# Patient Record
Sex: Female | Born: 1980 | Race: Black or African American | Hispanic: No | Marital: Single | Smoking: Former smoker
Health system: Southern US, Community
[De-identification: ages and names within clinical notes are randomized; demographics above are authoritative.]

## PROBLEM LIST (undated history)

## (undated) DIAGNOSIS — F32A Depression, unspecified: Secondary | ICD-10-CM

## (undated) DIAGNOSIS — N760 Acute vaginitis: Principal | ICD-10-CM

## (undated) DIAGNOSIS — A549 Gonococcal infection, unspecified: Secondary | ICD-10-CM

## (undated) DIAGNOSIS — F329 Major depressive disorder, single episode, unspecified: Secondary | ICD-10-CM

## (undated) DIAGNOSIS — R87629 Unspecified abnormal cytological findings in specimens from vagina: Secondary | ICD-10-CM

## (undated) DIAGNOSIS — R87619 Unspecified abnormal cytological findings in specimens from cervix uteri: Secondary | ICD-10-CM

## (undated) DIAGNOSIS — IMO0002 Reserved for concepts with insufficient information to code with codable children: Secondary | ICD-10-CM

## (undated) DIAGNOSIS — A749 Chlamydial infection, unspecified: Secondary | ICD-10-CM

## (undated) DIAGNOSIS — B9689 Other specified bacterial agents as the cause of diseases classified elsewhere: Secondary | ICD-10-CM

## (undated) DIAGNOSIS — N926 Irregular menstruation, unspecified: Secondary | ICD-10-CM

## (undated) DIAGNOSIS — B977 Papillomavirus as the cause of diseases classified elsewhere: Secondary | ICD-10-CM

## (undated) HISTORY — DX: Chlamydial infection, unspecified: A74.9

## (undated) HISTORY — DX: Acute vaginitis: N76.0

## (undated) HISTORY — DX: Unspecified abnormal cytological findings in specimens from vagina: R87.629

## (undated) HISTORY — DX: Reserved for concepts with insufficient information to code with codable children: IMO0002

## (undated) HISTORY — PX: LEEP: SHX91

## (undated) HISTORY — DX: Other specified bacterial agents as the cause of diseases classified elsewhere: B96.89

## (undated) HISTORY — PX: OTHER SURGICAL HISTORY: SHX169

## (undated) HISTORY — DX: Irregular menstruation, unspecified: N92.6

## (undated) HISTORY — DX: Gonococcal infection, unspecified: A54.9

## (undated) HISTORY — DX: Papillomavirus as the cause of diseases classified elsewhere: B97.7

## (undated) HISTORY — DX: Unspecified abnormal cytological findings in specimens from cervix uteri: R87.619

---

## 2001-02-24 ENCOUNTER — Encounter: Payer: Self-pay | Admitting: *Deleted

## 2001-02-24 ENCOUNTER — Emergency Department (HOSPITAL_COMMUNITY): Admission: EM | Admit: 2001-02-24 | Discharge: 2001-02-24 | Payer: Self-pay | Admitting: *Deleted

## 2001-09-04 ENCOUNTER — Other Ambulatory Visit: Admission: RE | Admit: 2001-09-04 | Discharge: 2001-09-04 | Payer: Self-pay

## 2002-02-13 ENCOUNTER — Other Ambulatory Visit: Admission: RE | Admit: 2002-02-13 | Discharge: 2002-02-13 | Payer: Self-pay | Admitting: Obstetrics and Gynecology

## 2002-10-29 ENCOUNTER — Emergency Department (HOSPITAL_COMMUNITY): Admission: EM | Admit: 2002-10-29 | Discharge: 2002-10-30 | Payer: Self-pay

## 2003-04-19 ENCOUNTER — Emergency Department (HOSPITAL_COMMUNITY): Admission: EM | Admit: 2003-04-19 | Discharge: 2003-04-19 | Payer: Self-pay | Admitting: Emergency Medicine

## 2003-06-11 ENCOUNTER — Emergency Department (HOSPITAL_COMMUNITY): Admission: EM | Admit: 2003-06-11 | Discharge: 2003-06-12 | Payer: Self-pay | Admitting: Emergency Medicine

## 2003-07-06 ENCOUNTER — Inpatient Hospital Stay (HOSPITAL_COMMUNITY): Admission: AD | Admit: 2003-07-06 | Discharge: 2003-07-06 | Payer: Self-pay | Admitting: Family Medicine

## 2003-07-06 ENCOUNTER — Emergency Department (HOSPITAL_COMMUNITY): Admission: EM | Admit: 2003-07-06 | Discharge: 2003-07-06 | Payer: Self-pay | Admitting: Emergency Medicine

## 2003-09-25 ENCOUNTER — Ambulatory Visit (HOSPITAL_COMMUNITY): Admission: AD | Admit: 2003-09-25 | Discharge: 2003-09-25 | Payer: Self-pay | Admitting: Obstetrics and Gynecology

## 2003-11-09 ENCOUNTER — Inpatient Hospital Stay (HOSPITAL_COMMUNITY): Admission: AD | Admit: 2003-11-09 | Discharge: 2003-11-09 | Payer: Self-pay | Admitting: Obstetrics

## 2003-12-14 ENCOUNTER — Inpatient Hospital Stay (HOSPITAL_COMMUNITY): Admission: AD | Admit: 2003-12-14 | Discharge: 2003-12-15 | Payer: Self-pay | Admitting: Obstetrics and Gynecology

## 2003-12-23 ENCOUNTER — Inpatient Hospital Stay (HOSPITAL_COMMUNITY): Admission: AD | Admit: 2003-12-23 | Discharge: 2003-12-26 | Payer: Self-pay | Admitting: Obstetrics & Gynecology

## 2005-03-10 ENCOUNTER — Inpatient Hospital Stay (HOSPITAL_COMMUNITY): Admission: AD | Admit: 2005-03-10 | Discharge: 2005-03-11 | Payer: Self-pay | Admitting: Obstetrics & Gynecology

## 2006-04-06 ENCOUNTER — Emergency Department (HOSPITAL_COMMUNITY): Admission: EM | Admit: 2006-04-06 | Discharge: 2006-04-07 | Payer: Self-pay | Admitting: Emergency Medicine

## 2006-10-11 ENCOUNTER — Emergency Department (HOSPITAL_COMMUNITY): Admission: EM | Admit: 2006-10-11 | Discharge: 2006-10-11 | Payer: Self-pay | Admitting: Family Medicine

## 2007-10-04 ENCOUNTER — Ambulatory Visit: Payer: Self-pay | Admitting: Gastroenterology

## 2008-12-01 ENCOUNTER — Emergency Department (HOSPITAL_COMMUNITY): Admission: EM | Admit: 2008-12-01 | Discharge: 2008-12-01 | Payer: Self-pay | Admitting: Emergency Medicine

## 2008-12-14 ENCOUNTER — Emergency Department (HOSPITAL_COMMUNITY): Admission: EM | Admit: 2008-12-14 | Discharge: 2008-12-14 | Payer: Self-pay | Admitting: Ophthalmology

## 2009-02-03 ENCOUNTER — Other Ambulatory Visit: Admission: RE | Admit: 2009-02-03 | Discharge: 2009-02-03 | Payer: Self-pay | Admitting: Obstetrics and Gynecology

## 2010-04-13 LAB — WET PREP, GENITAL
Clue Cells Wet Prep HPF POC: NONE SEEN
Trich, Wet Prep: NONE SEEN
Yeast Wet Prep HPF POC: NONE SEEN

## 2010-04-13 LAB — URINALYSIS, ROUTINE W REFLEX MICROSCOPIC
Bilirubin Urine: NEGATIVE
Bilirubin Urine: NEGATIVE
Glucose, UA: 250 mg/dL — AB
Glucose, UA: NEGATIVE mg/dL
Hgb urine dipstick: NEGATIVE
Hgb urine dipstick: NEGATIVE
Ketones, ur: NEGATIVE mg/dL
Ketones, ur: NEGATIVE mg/dL
Nitrite: NEGATIVE
Nitrite: NEGATIVE
Protein, ur: NEGATIVE mg/dL
Protein, ur: NEGATIVE mg/dL
Specific Gravity, Urine: 1.015 (ref 1.005–1.030)
Specific Gravity, Urine: 1.025 (ref 1.005–1.030)
Urobilinogen, UA: 0.2 mg/dL (ref 0.0–1.0)
Urobilinogen, UA: 1 mg/dL (ref 0.0–1.0)
pH: 6 (ref 5.0–8.0)
pH: 7.5 (ref 5.0–8.0)

## 2010-04-13 LAB — URINE MICROSCOPIC-ADD ON

## 2010-04-13 LAB — PREGNANCY, URINE: Preg Test, Ur: NEGATIVE

## 2010-04-13 LAB — GC/CHLAMYDIA PROBE AMP, GENITAL: Chlamydia, DNA Probe: NEGATIVE

## 2010-05-25 NOTE — Consult Note (Signed)
NAME:  Graziani, Shauntea C.           ACCOUNT NO.:  [id number]   MEDICAL RECORD NO.:  [id number]         PATIENT TYPE:  PAMB   LOCATION:  DAY                           FACILITY:  [facility]   PHYSICIAN:  Kassie Mends, M.D.      DATE OF BIRTH:  May 24, 1980   DATE OF CONSULTATION:  DATE OF DISCHARGE:                                 CONSULTATION   REFERRING PHYSICIAN:  Tilda Burrow, MD   REASON FOR CONSULTATION:  Heme-positive stool.   HISTORY OF PRESENT ILLNESS:  Ms. Hubers is a 30 year old female who had  Hemoccult obtained in the office and they were positive.  She complains  of abdominal pain after eating.  She has bad cramps and that has little  balls of stool that passes.  She does not strain to have a bowel  movement.  Having a bowel movement sometimes makes her stomach feel  better.  She is also having random rectal pain.  She denies any rectal  itching.  No weight loss.  She denies any problem swallowing, nausea,  vomiting, fever, or chills.  She denies any diarrhea or black tarry  stools.  She has been having hard stools for the last 1-1/2 years.  She  has not had a menstrual period for approximately 2 years.  Sometimes,  she does spot because she has an IUD.  She has had 2 vaginal births,  with the first child she had a tear, which required stitches.   PAST MEDICAL HISTORY:  Cervix prolapse.   PAST SURGICAL HISTORY:  Dental surgery with extraction of 12 teeth.   ALLERGIES:  No known drug allergies.   MEDICATIONS:  1. Ibuprofen as needed.  2. Hydrocodone as needed.   FAMILY HISTORY:  She has a family history colon cancer and colon polyps.   SOCIAL HISTORY:  She is single and has 2 children age 33 and 65.  She is  currently a Physicist, medical, working on Copy.  She was laid off from [employer].  She quit smoking approximately  6 months ago.  She denies any alcohol use.   PHYSICAL EXAMINATION:  VITAL SIGNS:  Weight 134 pounds, height 64  inches,  temperature 98.2, blood pressure 120/74, and pulse 80.  GENERAL:  She is in no apparent distress.  Alert and oriented x4.HEENT:  Atraumatic, normocephalic with edema bilaterally in her face, posterior  oropharynx without erythema or exudate.  No oral lesions  appreciated.NECK:  Full range of motion.  No lymphadenopathy.LUNGS:  Clear to auscultation bilaterally.CARDIOVASCULAR:  Regular rhythm.  Normal S1 and S2.  ABDOMEN:  Bowel sounds are present, soft, nondistended with mild  tenderness to palpation in the bilateral lower quadrants without rebound  or guarding.EXTREMITIES:  No cyanosis or edema.NEURO:  She has no focal  neurologic deficits.   ASSESSMENT:  Ms. Sipe is a 30 year old female with constipation and  rectal bleeding.  The differential diagnoses includes hemorrhoids, anal  fissure, and likely colorectal cancer or polyp. Thank you for allowing  me to see Ms. Mcmonigle in consultation.  My recommendations follow.   RECOMMENDATIONS:  1. Ms. Arambula will  be scheduled for a colonoscopy in the second week      of October to allow her to recover from her oral surgery.  She will      be given a Trilyte bowel prep.  2. For her constipation, she needs to drink 6-8 cups of water a day,      high-fiber diet, and if that is not sufficient then we will add a      fiber supplements and/or Amitiza.  3. She has a follow up appointment to see me in 2 months.      Kassie Mends, M.D.  Electronically Signed     SM/MEDQ  D:  10/04/2007  T:  10/05/2007  Job:  [id number]   cc:   Tilda Burrow, M.D.  Fax: [phone]

## 2010-08-16 ENCOUNTER — Encounter: Payer: Self-pay | Admitting: *Deleted

## 2010-08-16 DIAGNOSIS — S71009A Unspecified open wound, unspecified hip, initial encounter: Secondary | ICD-10-CM | POA: Insufficient documentation

## 2010-08-16 DIAGNOSIS — Y92009 Unspecified place in unspecified non-institutional (private) residence as the place of occurrence of the external cause: Secondary | ICD-10-CM | POA: Insufficient documentation

## 2010-08-16 DIAGNOSIS — S71109A Unspecified open wound, unspecified thigh, initial encounter: Secondary | ICD-10-CM | POA: Insufficient documentation

## 2010-08-16 DIAGNOSIS — W261XXA Contact with sword or dagger, initial encounter: Secondary | ICD-10-CM | POA: Insufficient documentation

## 2010-08-16 DIAGNOSIS — J45909 Unspecified asthma, uncomplicated: Secondary | ICD-10-CM | POA: Insufficient documentation

## 2010-08-16 DIAGNOSIS — W260XXA Contact with knife, initial encounter: Secondary | ICD-10-CM | POA: Insufficient documentation

## 2010-08-16 NOTE — ED Notes (Signed)
Pt has small laceration to right inner thigh; pt states she was opening her oven and had knife in her hand when she accidentally cut leg

## 2010-08-17 ENCOUNTER — Emergency Department (HOSPITAL_COMMUNITY)
Admission: EM | Admit: 2010-08-17 | Discharge: 2010-08-17 | Disposition: A | Discharge status: Home / Self Care | Payer: Medicaid Other | Attending: Emergency Medicine | Admitting: Emergency Medicine

## 2010-08-17 DIAGNOSIS — S71111A Laceration without foreign body, right thigh, initial encounter: Secondary | ICD-10-CM

## 2010-08-17 HISTORY — DX: Major depressive disorder, single episode, unspecified: F32.9

## 2010-08-17 HISTORY — DX: Depression, unspecified: F32.A

## 2010-08-17 MED ORDER — IBUPROFEN 800 MG PO TABS
800.0000 mg | ORAL_TABLET | Freq: Once | ORAL | Status: AC
  Administered 2010-08-17: 800 mg via ORAL
  Filled 2010-08-17: qty 1

## 2010-08-17 MED ORDER — IBUPROFEN 800 MG PO TABS: 800.0000 mg | ORAL_TABLET | Freq: Three times a day (TID) | ORAL | Status: DC

## 2010-08-17 MED ORDER — TETANUS-DIPHTH-ACELL PERTUSSIS 5-2.5-18.5 LF-MCG/0.5 IM SUSP
0.5000 mL | Freq: Once | INTRAMUSCULAR | Status: AC
  Administered 2010-08-17: 0.5 mL via INTRAMUSCULAR
  Filled 2010-08-17: qty 0.5

## 2010-08-17 NOTE — ED Provider Notes (Signed)
History     CSN: [id number] Arrival date & time: 08/17/2010 12:00 AM  Chief Complaint  Patient presents with  . Laceration    to right inner thigh   HPI Comments: Patient suffered a small stab incision to her right proximal medial thigh approximately 2 hours prior to arrival, onset was acute, pain is persistent, mild, worse with palpation. There was some associated bleeding which has since resolved. She did poor hydrogen peroxide into the wound prior to arrival. In eyes any other injuries. States this was an accidental stab that she had a knife in her hand while trying to open the oven door.  Patient is a 30 y.o. female presenting with skin laceration. The history is provided by the patient.  Laceration     Past Medical History  Diagnosis Date  . Asthma   . Depression     History reviewed. No pertinent past surgical history.  History reviewed. No pertinent family history.  History  Substance Use Topics  . Smoking status: Current Some Day Smoker  . Smokeless tobacco: Not on file  . Alcohol Use: No    OB History    Grav Para Term Preterm Abortions TAB SAB Ect Mult Living                  Review of Systems  Constitutional: Negative for fever.  Gastrointestinal: Negative for vomiting.  Skin: Positive for wound.       Laceration  Neurological: Negative for weakness and numbness.    Physical Exam  BP 119/74  Pulse 69  Temp(Src) 98.5 F (36.9 C) (Oral)  Resp 20  Ht 5\' 5"  (1.651 m)  Wt 139 lb (63.05 kg)  BMI 23.13 kg/m2  SpO2 100%  Physical Exam  Constitutional: She appears well-developed and well-nourished. No distress.  Eyes: Conjunctivae are normal. No scleral icterus.  Pulmonary/Chest: Effort normal. No respiratory distress.  Musculoskeletal: Normal range of motion. She exhibits tenderness. She exhibits no edema.       Tender to palpation over the laceration site.  Neurological: She is alert.       Odor and sensory intact to the right lower extremity. Gait  is normal.  Skin: She is not diaphoretic.       1 cm linear incision to the right medial proximal thigh. Bleeding controlled, subcutaneous fat exposed.    ED Course  LACERATION REPAIR Date/Time: 08/17/2010 12:43 AM Performed by: Eber Hong D Authorized by: Eber Hong D Consent: Verbal consent obtained. Written consent not obtained. Risks and benefits: risks, benefits and alternatives were discussed Consent given by: patient Patient understanding: patient states understanding of the procedure being performed Patient identity confirmed: verbally with patient Location: Right thigh. Laceration length: 1 cm Foreign bodies: no foreign bodies Tendon involvement: none Nerve involvement: none Vascular damage: no Anesthesia method: None. Patient sedated: no Preparation: Patient was prepped and draped in the usual sterile fashion. Irrigation solution: saline Amount of cleaning: standard Debridement: none Degree of undermining: none Skin closure: staples Number of sutures: 2 Technique: simple Approximation: close Approximation difficulty: simple Dressing: 4x4 sterile gauze Patient tolerance: Patient tolerated the procedure well with no immediate complications.    MDM Overall patient appears well without signs of distress, bleeding, but vascular compromise. Please see repair note.      Vida Roller, MD 08/17/10 [phone]

## 2010-08-27 ENCOUNTER — Encounter (HOSPITAL_COMMUNITY): Payer: Self-pay | Admitting: Emergency Medicine

## 2010-08-27 ENCOUNTER — Emergency Department (HOSPITAL_COMMUNITY)
Admission: EM | Admit: 2010-08-27 | Discharge: 2010-08-27 | Disposition: A | Discharge status: Home / Self Care | Payer: Medicaid Other | Attending: Emergency Medicine | Admitting: Emergency Medicine

## 2010-08-27 DIAGNOSIS — L089 Local infection of the skin and subcutaneous tissue, unspecified: Secondary | ICD-10-CM | POA: Insufficient documentation

## 2010-08-27 DIAGNOSIS — Z4802 Encounter for removal of sutures: Secondary | ICD-10-CM | POA: Insufficient documentation

## 2010-08-27 MED ORDER — SULFAMETHOXAZOLE-TRIMETHOPRIM 800-160 MG PO TABS: 1.0000 | ORAL_TABLET | Freq: Two times a day (BID) | ORAL | Status: AC

## 2010-08-27 MED ORDER — CEPHALEXIN 500 MG PO CAPS: 1000.0000 mg | ORAL_CAPSULE | Freq: Two times a day (BID) | ORAL | Status: AC

## 2010-08-27 NOTE — ED Notes (Signed)
Staple removal kit at bedside  

## 2010-08-27 NOTE — ED Notes (Signed)
Pt here to have staples removed from her right inner thigh. Pt states the is drainage from the incision.

## 2010-08-27 NOTE — ED Provider Notes (Signed)
Scribed for Dr. Fonnie Jarvis, the patient was seen in room 07. This chart was scribed by Hillery Hunter. This patient's care was started at 09:59.  History     CSN: [id number] Arrival date & time: 08/27/2010  9:19 AM  Chief Complaint  Patient presents with  . Suture / Staple Removal   HPI  Kimberly [name] is a 30 y.o. female who presents to the Emergency Department as per scheduled staples removal from right inner thigh. She has had two days of gradually increasing pain and tenderness to the area with a small amount of drainage and redness. She denies fever, cough, shortness of breath and other systemic complaints.   Past Medical History  Diagnosis Date  . Asthma   . Depression     History reviewed. No pertinent past surgical history.  History reviewed. No pertinent family history.  History  Substance Use Topics  . Smoking status: Current Some Day Smoker -- 0.5 packs/day    Types: Cigarettes  . Smokeless tobacco: Not on file  . Alcohol Use: No    Review of Systems  Constitutional: Negative for fever.  Respiratory: Negative for shortness of breath.   Cardiovascular: Negative for chest pain.  Gastrointestinal: Negative for nausea, vomiting and abdominal pain.  Psychiatric/Behavioral: Negative for confusion.  All other systems reviewed and are negative.    Physical Exam  BP 108/63  Pulse 63  Temp(Src) 98.3 F (36.8 C) (Oral)  Resp 20  Ht 5\' 5"  (1.651 m)  Wt 140 lb (63.504 kg)  BMI 23.30 kg/m2  SpO2 100%  Physical Exam  Nursing note and vitals reviewed. Constitutional: She is oriented to person, place, and time. She appears well-developed and well-nourished. No distress.  HENT:  Head: Normocephalic.  Eyes: EOM are normal.  Neck: Normal range of motion.  Cardiovascular: Intact distal pulses.   Pulmonary/Chest: Effort normal.  Musculoskeletal: Normal range of motion.  Neurological: She is alert and oriented to person, place, and time.  Skin: Skin is  warm and dry.       Right inner thigh staples intact with 1cm wound healing with 2cm area of surrounding erythema, tenderness and a scant amount of purulent drainage. Neurovascular intact distally, DP pulses strong and symmetrical  Psychiatric: She has a normal mood and affect.    ED Course  Procedures  DIAGNOSTIC STUDIES: Oxygen Saturation is 100% on room air, normal by my interpretation.     MDM:    IMPRESSION: Diagnoses that have been ruled out:  Diagnoses that are still under consideration:  Final diagnoses:  Staple removal  Wound infection    PLAN:  Discharge home with ABX, discussed instructions with patient.   CONDITION ON DISCHARGE: Good   DISCHARGE MEDICATIONS: New Prescriptions   CEPHALEXIN (KEFLEX) 500 MG CAPSULE    Take 2 capsules (1,000 mg total) by mouth 2 (two) times daily. For 7 days   SULFAMETHOXAZOLE-TRIMETHOPRIM (BACTRIM DS,SEPTRA DS) 800-160 MG PER TABLET    Take 1 tablet by mouth 2 (two) times daily.     Scribe Attestation I personally performed the services described in this documentation, which was scribed in my presence. The recorded information has been reviewed and considered. No att. providers found    Hurman Horn, MD 09/10/10 2134

## 2011-11-10 ENCOUNTER — Other Ambulatory Visit: Payer: Self-pay | Admitting: Adult Health

## 2011-11-10 ENCOUNTER — Other Ambulatory Visit (HOSPITAL_COMMUNITY)
Admission: RE | Admit: 2011-11-10 | Discharge: 2011-11-10 | Disposition: A | Discharge status: Home / Self Care | Payer: Medicaid Other | Source: Ambulatory Visit | Attending: Obstetrics and Gynecology | Admitting: Obstetrics and Gynecology

## 2011-11-10 DIAGNOSIS — Z113 Encounter for screening for infections with a predominantly sexual mode of transmission: Secondary | ICD-10-CM | POA: Insufficient documentation

## 2011-11-10 DIAGNOSIS — Z1151 Encounter for screening for human papillomavirus (HPV): Secondary | ICD-10-CM | POA: Insufficient documentation

## 2011-11-10 DIAGNOSIS — Z01419 Encounter for gynecological examination (general) (routine) without abnormal findings: Secondary | ICD-10-CM | POA: Insufficient documentation

## 2012-02-16 ENCOUNTER — Other Ambulatory Visit: Payer: Self-pay | Admitting: Gastroenterology

## 2012-02-16 ENCOUNTER — Ambulatory Visit (INDEPENDENT_AMBULATORY_CARE_PROVIDER_SITE_OTHER): Payer: Medicaid Other | Admitting: Gastroenterology

## 2012-02-16 ENCOUNTER — Encounter: Payer: Self-pay | Admitting: Gastroenterology

## 2012-02-16 VITALS — BP 114/59 | HR 81 | Temp 98.1°F | Ht 66.0 in | Wt 162.4 lb

## 2012-02-16 DIAGNOSIS — K625 Hemorrhage of anus and rectum: Secondary | ICD-10-CM

## 2012-02-16 DIAGNOSIS — R140 Abdominal distension (gaseous): Secondary | ICD-10-CM

## 2012-02-16 DIAGNOSIS — K5900 Constipation, unspecified: Secondary | ICD-10-CM

## 2012-02-16 DIAGNOSIS — R141 Gas pain: Secondary | ICD-10-CM

## 2012-02-16 MED ORDER — LINACLOTIDE 145 MCG PO CAPS: 145.0000 ug | ORAL_CAPSULE | Freq: Every day | ORAL | Status: DC

## 2012-02-16 NOTE — Progress Notes (Signed)
Referring Provider: Avon Gully, MD Primary Care Physician:  Avon Gully, MD Primary Gastroenterologist:  Dr. Darrick Penna   Chief Complaint  Patient presents with  . Abdominal Pain    HPI:   Kimberly [name] is a 32 year old female who presents today at the request of Dr. Felecia Shelling secondary to abdominal pain. She was last seen by Dr. Darrick Penna in 2009 due to heme positive stool. At that time, a colonoscopy was recommended. However, this was not completed as she states she was scared.  States intermittent lower abdominal pain, can feel the bowel movements scraping through. Then notes rectal discomfort, sometimes can't sit down because the pain is so bad. Feels internal. Scant hematochezia with straining. BM usually three times per week. Usually looks like #1 on Bristol scale. Occasionally # 3. Symptoms present for many years. No loss of appetite. Sometimes when trying to have BM will feel nauseated, have to vomit. No wt loss. Complains of abdominal bloating. Even with water, she feels like her abdomen bloats.    Past Medical History  Diagnosis Date  . Asthma   . Depression     Past Surgical History  Procedure Date  . None     Current Outpatient Prescriptions  Medication Sig Dispense Refill  . mirtazapine (REMERON) 15 MG tablet Take 15 mg by mouth at bedtime. Patient has not refilled since 06-2009       . NON FORMULARY Takes a med for depression occasionally but unsure of name         Allergies as of 02/16/2012  . (No Known Allergies)    Family History  Problem Relation Age of Onset  . Colon cancer Neg Hx     History   Social History  . Marital Status: Single    Spouse Name: N/A    Number of Children: N/A  . Years of Education: N/A   Occupational History  . Not on file.   Social History Main Topics  . Smoking status: Current Some Day Smoker -- 0.5 packs/day    Types: Cigarettes  . Smokeless tobacco: Not on file  . Alcohol Use: No  . Drug Use: No  . Sexually Active: Yes    Birth Control/ Protection: IUD   Other Topics Concern  . Not on file   Social History Narrative  . No narrative on file    Review of Systems: Gen: SEE HPI CV: Denies chest pain, heart palpitations, syncope, peripheral edema. Resp: Denies shortness of breath with rest, cough, wheezing GI: SEE HPI GU : Denies urinary burning, urinary frequency, urinary incontinence.  MS: Denies joint pain, muscle weakness, cramps, limited movement Derm: Denies rash, itching, dry skin Psych: Denies depression, anxiety, confusion or memory loss  Heme: Denies bruising, bleeding, and enlarged lymph nodes.  Physical Exam: BP 114/59  Pulse 81  Temp 98.1 F (36.7 C) (Oral)  Ht 5\' 6"  (1.676 m)  Wt 162 lb 6.4 oz (73.664 kg)  BMI 26.21 kg/m2 General:   Alert and oriented. Well-developed, well-nourished, pleasant and cooperative. Head:  Normocephalic and atraumatic. Eyes:  Conjunctiva pink, sclera clear, no icterus.   Conjunctiva pink. Ears:  Normal auditory acuity. Nose:  No deformity, discharge,  or lesions. Mouth:  No deformity or lesions, mucosa pink and moist.  Neck:  Supple, without mass or thyromegaly. Lungs:  Clear to auscultation bilaterally, without wheezing, rales, or rhonchi.  Heart:  S1, S2 present without murmurs noted.  Abdomen:  +BS, soft, non-tender and non-distended. Without mass or HSM. No rebound or guarding. No  hernias noted. Rectal:  Deferred  Msk:  Symmetrical without gross deformities. Normal posture. Pulses:  Normal pulses noted. Extremities:  Without clubbing or edema. Neurologic:  Alert and  oriented x4;  grossly normal neurologically. Skin:  Intact, warm and dry without significant lesions or rashes Cervical Nodes:  No significant cervical adenopathy. Psych:  Alert and cooperative. Normal mood and affect.

## 2012-02-16 NOTE — Patient Instructions (Addendum)
Please have blood work completed.  Start taking Linzess 1 capsule each morning before breakfast. I have sent a prescription to your pharmacy and provided samples.  Review the high fiber diet.  We have set you up for a colonoscopy with Dr. Darrick Penna in the near future.   High-Fiber Diet Fiber is found in fruits, vegetables, and grains. A high-fiber diet encourages the addition of more whole grains, legumes, fruits, and vegetables in your diet. The recommended amount of fiber for adult males is 38 g per day. For adult females, it is 25 g per day. Pregnant and lactating women should get 28 g of fiber per day. If you have a digestive or bowel problem, ask your caregiver for advice before adding high-fiber foods to your diet. Eat a variety of high-fiber foods instead of only a select few type of foods.   PURPOSE  To increase stool bulk.   To make bowel movements more regular to prevent constipation.   To lower cholesterol.   To prevent overeating.  WHEN IS THIS DIET USED?  It may be used if you have constipation and hemorrhoids.   It may be used if you have uncomplicated diverticulosis (intestine condition) and irritable bowel syndrome.   It may be used if you need help with weight management.   It may be used if you want to add it to your diet as a protective measure against atherosclerosis, diabetes, and cancer.  SOURCES OF FIBER  Whole-grain breads and cereals.   Fruits, such as apples, oranges, bananas, berries, prunes, and pears.   Vegetables, such as green peas, carrots, sweet potatoes, beets, broccoli, cabbage, spinach, and artichokes.   Legumes, such split peas, soy, lentils.   Almonds.  FIBER CONTENT IN FOODS Starches and Grains / Dietary Fiber (g)  Cheerios, 1 cup / 3 g   Corn Flakes cereal, 1 cup / 0.7 g   Rice crispy treat cereal, 1 cup / 0.3 g   Instant oatmeal (cooked),  cup / 2 g   Frosted wheat cereal, 1 cup / 5.1 g   Brown, long-grain rice (cooked), 1  cup / 3.5 g   White, long-grain rice (cooked), 1 cup / 0.6 g   Enriched macaroni (cooked), 1 cup / 2.5 g  Legumes / Dietary Fiber (g)  Baked beans (canned, plain, or vegetarian),  cup / 5.2 g   Kidney beans (canned),  cup / 6.8 g   Pinto beans (cooked),  cup / 5.5 g  Breads and Crackers / Dietary Fiber (g)  Plain or honey graham crackers, 2 squares / 0.7 g   Saltine crackers, 3 squares / 0.3 g   Plain, salted pretzels, 10 pieces / 1.8 g   Whole-wheat bread, 1 slice / 1.9 g   White bread, 1 slice / 0.7 g   Raisin bread, 1 slice / 1.2 g   Plain bagel, 3 oz / 2 g   Flour tortilla, 1 oz / 0.9 g   Corn tortilla, 1 small / 1.5 g   Hamburger or hotdog bun, 1 small / 0.9 g  Fruits / Dietary Fiber (g)  Apple with skin, 1 medium / 4.4 g   Sweetened applesauce,  cup / 1.5 g   Banana,  medium / 1.5 g   Grapes, 10 grapes / 0.4 g   Orange, 1 small / 2.3 g   Raisin, 1.5 oz / 1.6 g   Melon, 1 cup / 1.4 g  Vegetables / Dietary Fiber (g)  [place]  beans (canned),  cup / 1.3 g   Carrots (cooked),  cup / 2.3 g   Broccoli (cooked),  cup / 2.8 g   Peas (cooked),  cup / 4.4 g   Mashed potatoes,  cup / 1.6 g   Lettuce, 1 cup / 0.5 g   Corn (canned),  cup / 1.6 g   Tomato,  cup / 1.1 g  Document Released: 12/27/2004 Document Revised: 06/28/2011 Document Reviewed: 03/31/2011 [facility] Patient Information 2013 [place], [place].

## 2012-02-18 DIAGNOSIS — R140 Abdominal distension (gaseous): Secondary | ICD-10-CM | POA: Insufficient documentation

## 2012-02-18 DIAGNOSIS — K625 Hemorrhage of anus and rectum: Secondary | ICD-10-CM | POA: Insufficient documentation

## 2012-02-18 LAB — TSH: TSH: 0.945 u[IU]/mL (ref 0.350–4.500)

## 2012-02-18 NOTE — Assessment & Plan Note (Signed)
32 year old female with hx of lower abdominal pain, constipation, rectal discomfort, and intermittent paper hematochezia. No prior colonoscopy. 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 However, needs lower GI evaluation in the near future. Pt is concerned regarding abdominal bloating as well. Check labs as outlined below and proceed with lower GI evaluation.  Proceed with colonoscopy with Dr. Darrick Penna in the near future. The risks, benefits, and alternatives have been discussed in detail with the patient. They state understanding and desire to proceed.  TSH, celiac serology Linzess 145 mcg daily High fiber daily

## 2012-02-18 NOTE — Assessment & Plan Note (Signed)
Likely secondary to IBS, constipation. Start high fiber diet and Linzess. TCS as planned.

## 2012-02-20 ENCOUNTER — Encounter (HOSPITAL_COMMUNITY)
Admission: RE | Disposition: A | Discharge status: Home / Self Care | Payer: Self-pay | Source: Ambulatory Visit | Attending: Gastroenterology

## 2012-02-20 ENCOUNTER — Ambulatory Visit (HOSPITAL_COMMUNITY)
Admission: RE | Admit: 2012-02-20 | Discharge: 2012-02-20 | Disposition: A | Discharge status: Home / Self Care | Payer: Medicaid Other | Source: Ambulatory Visit | Attending: Gastroenterology | Admitting: Gastroenterology

## 2012-02-20 ENCOUNTER — Encounter (HOSPITAL_COMMUNITY): Payer: Self-pay | Admitting: *Deleted

## 2012-02-20 DIAGNOSIS — K648 Other hemorrhoids: Secondary | ICD-10-CM

## 2012-02-20 DIAGNOSIS — R109 Unspecified abdominal pain: Secondary | ICD-10-CM

## 2012-02-20 DIAGNOSIS — K625 Hemorrhage of anus and rectum: Secondary | ICD-10-CM

## 2012-02-20 DIAGNOSIS — K5900 Constipation, unspecified: Secondary | ICD-10-CM

## 2012-02-20 DIAGNOSIS — K5909 Other constipation: Secondary | ICD-10-CM | POA: Insufficient documentation

## 2012-02-20 HISTORY — PX: COLONOSCOPY: SHX5424

## 2012-02-20 SURGERY — COLONOSCOPY
Anesthesia: Moderate Sedation

## 2012-02-20 MED ORDER — SODIUM CHLORIDE 0.45 % IV SOLN: INTRAVENOUS | Status: DC

## 2012-02-20 MED ORDER — MEPERIDINE HCL 100 MG/ML IJ SOLN
INTRAMUSCULAR | Status: AC
  Filled 2012-02-20: qty 2

## 2012-02-20 MED ORDER — PROMETHAZINE HCL 25 MG/ML IJ SOLN
INTRAMUSCULAR | Status: DC | PRN
  Administered 2012-02-20: 12.5 mg via INTRAVENOUS

## 2012-02-20 MED ORDER — SIMETHICONE 40 MG/0.6ML PO SUSP
ORAL | Status: DC | PRN
  Administered 2012-02-20: 15:00:00

## 2012-02-20 MED ORDER — MIDAZOLAM HCL 5 MG/5ML IJ SOLN
INTRAMUSCULAR | Status: AC
  Filled 2012-02-20: qty 10

## 2012-02-20 MED ORDER — PROMETHAZINE HCL 25 MG/ML IJ SOLN
INTRAMUSCULAR | Status: AC
  Filled 2012-02-20: qty 1

## 2012-02-20 MED ORDER — HYDROCORTISONE 2.5 % RE CREA: TOPICAL_CREAM | Freq: Two times a day (BID) | RECTAL | Status: DC

## 2012-02-20 MED ORDER — MEPERIDINE HCL 100 MG/ML IJ SOLN
INTRAMUSCULAR | Status: DC | PRN
  Administered 2012-02-20 (×2): 50 mg via INTRAVENOUS

## 2012-02-20 MED ORDER — SODIUM CHLORIDE 0.9 % IJ SOLN
INTRAMUSCULAR | Status: AC
  Filled 2012-02-20: qty 10

## 2012-02-20 MED ORDER — MIDAZOLAM HCL 5 MG/5ML IJ SOLN
INTRAMUSCULAR | Status: DC | PRN
  Administered 2012-02-20 (×2): 2 mg via INTRAVENOUS

## 2012-02-20 NOTE — Op Note (Signed)
[facility] [address] [place], [address]   COLONOSCOPY PROCEDURE REPORT  PATIENT: Kimberly [name], Kimberly [name]  MR#: [id number] BIRTHDATE: [birth year] , 31  yrs. old GENDER: Female ENDOSCOPIST: Jonette Eva, MD REFERRED UV:[id number] Fanta, M.D. PROCEDURE DATE:  02/20/2012 PROCEDURE:   Colonoscopy, screening INDICATIONS:Rectal Bleeding and ABDOMINAL PAIN AND CONSTIPATION. MEDICATIONS: Demerol 100 mg IV, Versed 4 mg IV, and Promethazine (Phenergan) 12.5mg  IV  DESCRIPTION OF PROCEDURE:    Physical exam was performed.  Informed consent was obtained from the patient after explaining the benefits, risks, and alternatives to procedure.  The patient was connected to monitor and placed in left lateral position. Continuous oxygen was provided by nasal cannula and IV medicine administered through an indwelling cannula.  After administration of sedation and rectal exam, the patients rectum was intubated and the EC-[id number] ([id number])  colonoscope was advanced under direct visualization to the ileum.  The scope was removed slowly by carefully examining the color, texture, anatomy, and integrity mucosa on the way out.  The patient was recovered in endoscopy and discharged home in satisfactory condition.    COLON FINDINGS: The mucosa appeared normal in the terminal ileum.  , The colon was otherwise normal.  There was no diverticulosis, inflammation, polyps or cancers unless previously stated.  , and Moderate sized internal hemorrhoids were found.  PREP QUALITY: good. PREP IN TRANSVERSE, DESCENDING & SIGMOID COLON WITH ASPIRATION/IRRIGATION. FAIR PREP IN RIGHT COLON. UNABLE TO ASPIRATE/IRRRIGATE RETAINED CONTENTS DUE TO PARTICULATE MATTER REPEATEDLY CLOGGING THE SCOPE.  CECAL W/D TIME: 19 minutes COMPLICATIONS: None  ENDOSCOPIC IMPRESSION: RECTAL BLEEDING DUE TO HEMORRHOIDS  RECOMMENDATIONS: FOLLOW A HIGH FIBER DIET.  AVOID ITEMS THAT CAUSE BLOATING. USE ANUSOL CREAM 2 TIMES A  DAY FOR 12 DAYS. FOLLOW UP IN Mar 22, 2012 AT 2 PM. CONSIDER REPEAT TCS WITH PEDS SCOPE AND OVERTUBE TO CLEAR RIGHT COLON WITHIN THE NEXT 6 MOS.       _______________________________ Rosalie DoctorJonette Eva, MD 02/20/2012 4:33 PM     PATIENT NAME:  Kimberly [name], Kimberly [name] MR#: [id number]

## 2012-02-20 NOTE — Progress Notes (Signed)
Faxed to PCP

## 2012-02-20 NOTE — H&P (Signed)
  Primary Care Physician:  Avon Gully, MD Primary Gastroenterologist:  Dr. Darrick Penna  Pre-Procedure History & Physical: HPI:  Kimberly [name] is a 32 y.o. female here for  BRBPR.   Past Medical History  Diagnosis Date  . Asthma   . Depression     Past Surgical History  Procedure Laterality Date  . None      Prior to Admission medications   Medication Sig Start Date End Date Taking? Authorizing Provider  Linaclotide (LINZESS) 145 MCG CAPS Take 1 capsule (145 mcg total) by mouth daily. 02/16/12  Yes Nira Retort, NP  mirtazapine (REMERON) 15 MG tablet Take 15 mg by mouth at bedtime. Patient has not refilled since 06-2009     Historical Provider, MD  NON FORMULARY Takes a med for depression occasionally but unsure of name    Historical Provider, MD    Allergies as of 02/16/2012  . (No Known Allergies)    Family History  Problem Relation Age of Onset  . Colon cancer Neg Hx     History   Social History  . Marital Status: Single    Spouse Name: N/A    Number of Children: N/A  . Years of Education: N/A   Occupational History  . Not on file.   Social History Main Topics  . Smoking status: Current Some Day Smoker -- 0.50 packs/day    Types: Cigarettes  . Smokeless tobacco: Not on file  . Alcohol Use: No  . Drug Use: No  . Sexually Active: Yes    Birth Control/ Protection: IUD   Other Topics Concern  . Not on file   Social History Narrative  . No narrative on file    Review of Systems: See HPI, otherwise negative ROS   Physical Exam: BP 113/69  Pulse 75  Temp(Src) 98.4 F (36.9 C) (Oral)  Resp 22  Ht 5\' 6"  (1.676 m)  Wt 162 lb 9.6 oz (73.755 kg)  BMI 26.26 kg/m2  SpO2 100% General:   Alert,  pleasant and cooperative in NAD Head:  Normocephalic and atraumatic. Neck:  Supple; Lungs:  Clear throughout to auscultation.    Heart:  Regular rate and rhythm. Abdomen:  Soft, nontender and nondistended. Normal bowel sounds, without guarding, and without  rebound.   Neurologic:  Alert and  oriented x4;  grossly normal neurologically.  Impression/Plan:    BRBPR  PLAN: TCS TODAY

## 2012-02-21 ENCOUNTER — Telehealth: Payer: Self-pay

## 2012-02-21 MED ORDER — LUBIPROSTONE 8 MCG PO CAPS: 8.0000 ug | ORAL_CAPSULE | Freq: Two times a day (BID) | ORAL | Status: DC

## 2012-02-21 NOTE — Telephone Encounter (Signed)
Received fax from the pts pharmacy- pt has medicaid and has to try and fail amitiza prior to being approved for linzess. Pt will need new rx sent to pharmacy.

## 2012-02-21 NOTE — Telephone Encounter (Signed)
Pt aware that we have changed rx from linzess to [place]. She is aware that she needs to take [place] with food and she is aware to not take linzess and amitiza at the same, I asked her to either finish the samples we gave her and then start amitiza or stop the linzess samples and start taking the amitiza. Pt verbalized understanding.

## 2012-02-21 NOTE — Telephone Encounter (Signed)
Noted.  I sent in rx for Amitiza 8 mcg po BID.

## 2012-02-21 NOTE — Progress Notes (Signed)
Quick Note:  Pt is aware. ______ 

## 2012-02-21 NOTE — Progress Notes (Signed)
Quick Note:  Celiac serology negative. TSH normal.  ______

## 2012-02-24 ENCOUNTER — Encounter (HOSPITAL_COMMUNITY): Payer: Self-pay | Admitting: Gastroenterology

## 2012-02-27 ENCOUNTER — Telehealth: Payer: Self-pay | Admitting: *Deleted

## 2012-02-27 NOTE — Telephone Encounter (Signed)
Pt is aware.  

## 2012-02-27 NOTE — Telephone Encounter (Signed)
Pt returned call. She said Dr. Darrick Penna had told her to call on Friday and let us know how she was doing, but she figured we would be closed. She has been doing well except not had a real good BM since colonoscopy on 02/20/2012.  She is taking the Linzess 145 mcg once a day. Please advise!

## 2012-02-27 NOTE — Telephone Encounter (Signed)
Ms Tullos called today. She had her colonoscopy last week. She said that she is doing ok, however she is constipated still. She would like a call back. Thank you.

## 2012-02-27 NOTE — Telephone Encounter (Signed)
LMOM to call.

## 2012-02-27 NOTE — Addendum Note (Signed)
Addended by: West Bali on: 02/27/2012 04:25 PM   Modules accepted: Orders, Medications

## 2012-02-27 NOTE — Telephone Encounter (Signed)
PLEASE CALL PT.  SHE SHOULD CONTINUE LINZESS. DRINK WATER. EAT FIBER. TAKE MIRALAX 1 OR 2 TIMES A DAY.

## 2012-03-21 ENCOUNTER — Encounter: Payer: Self-pay | Admitting: Gastroenterology

## 2012-03-22 ENCOUNTER — Encounter (INDEPENDENT_AMBULATORY_CARE_PROVIDER_SITE_OTHER): Payer: Medicaid Other | Admitting: Gastroenterology

## 2012-03-22 ENCOUNTER — Telehealth: Payer: Self-pay | Admitting: Gastroenterology

## 2012-03-22 NOTE — Telephone Encounter (Signed)
CONTACT PT TO [facility] E30 RECTAL BLEEDING

## 2012-03-22 NOTE — Progress Notes (Deleted)
  Subjective:    Patient ID: Kimberly [name], female    DOB: July 24, 1980, 31 y.o.   MRN: [id number]  PCP:  HPI    Review of Systems     Objective:   Physical Exam        Assessment & Plan:

## 2012-03-22 NOTE — Telephone Encounter (Signed)
Pt was a no show

## 2012-03-23 NOTE — Progress Notes (Signed)
ERROR

## 2012-03-26 ENCOUNTER — Telehealth: Payer: Self-pay | Admitting: Gastroenterology

## 2012-03-26 NOTE — Telephone Encounter (Signed)
Pt is aware of OV on 4/24 @ 11 with SF and appt card was mailed

## 2012-03-26 NOTE — Telephone Encounter (Signed)
error 

## 2012-04-16 ENCOUNTER — Encounter: Payer: Self-pay | Admitting: *Deleted

## 2012-04-16 DIAGNOSIS — J45909 Unspecified asthma, uncomplicated: Secondary | ICD-10-CM

## 2012-04-16 DIAGNOSIS — IMO0002 Reserved for concepts with insufficient information to code with codable children: Secondary | ICD-10-CM

## 2012-04-17 ENCOUNTER — Ambulatory Visit: Payer: Self-pay | Admitting: Adult Health

## 2012-04-24 ENCOUNTER — Ambulatory Visit: Payer: Self-pay | Admitting: Adult Health

## 2012-05-03 ENCOUNTER — Telehealth: Payer: Self-pay | Admitting: Gastroenterology

## 2012-05-03 ENCOUNTER — Ambulatory Visit: Payer: Self-pay | Admitting: Adult Health

## 2012-05-03 ENCOUNTER — Ambulatory Visit (INDEPENDENT_AMBULATORY_CARE_PROVIDER_SITE_OTHER): Payer: Medicaid Other | Admitting: Adult Health

## 2012-05-03 ENCOUNTER — Encounter (INDEPENDENT_AMBULATORY_CARE_PROVIDER_SITE_OTHER): Payer: Medicaid Other | Admitting: Gastroenterology

## 2012-05-03 ENCOUNTER — Encounter: Payer: Self-pay | Admitting: Adult Health

## 2012-05-03 VITALS — BP 120/76 | Ht 66.0 in | Wt 162.0 lb

## 2012-05-03 DIAGNOSIS — N760 Acute vaginitis: Secondary | ICD-10-CM

## 2012-05-03 DIAGNOSIS — N898 Other specified noninflammatory disorders of vagina: Secondary | ICD-10-CM

## 2012-05-03 DIAGNOSIS — B9689 Other specified bacterial agents as the cause of diseases classified elsewhere: Secondary | ICD-10-CM

## 2012-05-03 DIAGNOSIS — A499 Bacterial infection, unspecified: Secondary | ICD-10-CM

## 2012-05-03 DIAGNOSIS — N912 Amenorrhea, unspecified: Secondary | ICD-10-CM

## 2012-05-03 DIAGNOSIS — Z3202 Encounter for pregnancy test, result negative: Secondary | ICD-10-CM

## 2012-05-03 HISTORY — DX: Other specified bacterial agents as the cause of diseases classified elsewhere: B96.89

## 2012-05-03 LAB — POCT URINE PREGNANCY: Preg Test, Ur: NEGATIVE

## 2012-05-03 LAB — POCT URINALYSIS DIPSTICK
Bilirubin, UA: NEGATIVE
Glucose, UA: NEGATIVE
Ketones, UA: 15
Leukocytes, UA: NEGATIVE
Nitrite, UA: NEGATIVE

## 2012-05-03 LAB — POCT WET PREP (WET MOUNT)

## 2012-05-03 MED ORDER — METRONIDAZOLE 500 MG PO TABS: 500.0000 mg | ORAL_TABLET | Freq: Two times a day (BID) | ORAL | Status: DC

## 2012-05-03 NOTE — Progress Notes (Signed)
Subjective:     Patient ID: Kimberly [name], female   DOB: Dec 02, 1980, 32 y.o.   MRN: [id number]  HPI Kimberly [name] is a 32 year old black female in complaining of vaginal discharge x 3 weeks, it is thick and has odor,no itching or burning.She has an IUD and is not having a period.  Review of Systems +complaints as above Reviewed past medical,surgical, social and family history. Reviewed medications and allergies.     Objective:   Physical Exam Blood pressure 120/76, height 5\' 6"  (1.676 m), weight 162 lb (73.483 kg). Urine pregnancy test negative, urine +protein,wet prep + clue cells.Pelvic: external genitalia normal in appearance, vagina has frothy discharge with odor,cervix is bulbous with IUD strings visible, uterus felt to be NSSC,non tender,no adnexal masses or tenderness. Skin is warm and dry and she is alert and cooperative. She did say she may want her IUD in future but likes not having a period.    Assessment:      BV Vaginal discharge Amenorrhea secondary to IUD    Plan:      Rx. Flagyl 500 mg 1 bid x 7 days, no alcohol, no sex Follow up prn

## 2012-05-03 NOTE — Telephone Encounter (Signed)
REVIEWED. CONTACT PT TO [facility].  

## 2012-05-03 NOTE — Telephone Encounter (Signed)
Mailed letter to patient to call our office to [facility] and not to neglect her health °

## 2012-05-03 NOTE — Telephone Encounter (Signed)
Pt was a no show

## 2012-05-03 NOTE — Patient Instructions (Addendum)
Take flagyl no alcohol, no sex Follow up prn Sign up for my chart

## 2012-05-07 NOTE — Progress Notes (Signed)
error 

## 2012-06-12 NOTE — Progress Notes (Signed)
FEB 2014 INCOMPLETE TCS DUE TO POOR PREP IN RIGHT COLON/IH. PT NSx2. LETTER MAILED.  REVIEWED.

## 2013-07-17 ENCOUNTER — Telehealth: Payer: Self-pay | Admitting: *Deleted

## 2013-07-17 ENCOUNTER — Telehealth: Payer: Self-pay | Admitting: Obstetrics and Gynecology

## 2013-07-17 NOTE — Telephone Encounter (Signed)
Spoke with pt. Pt has the Mirena IUD. Pt has had no period in years. Pt started bleeding about 1 month ago. I advised she would need to be seen. Pt states her Medicaid has run out. I advised to call the health dept and see if they can see her. Pt states she did and they can't see her for 4 more weeks. Pt then asked how much would it cost to be seen here. I transferred the call to the front desk. JSY

## 2013-07-22 ENCOUNTER — Ambulatory Visit: Payer: Medicaid Other | Admitting: Adult Health

## 2013-07-22 NOTE — Telephone Encounter (Signed)
Was unable to reach pt and message was left

## 2013-08-07 ENCOUNTER — Other Ambulatory Visit (HOSPITAL_COMMUNITY): Payer: Self-pay | Admitting: Nurse Practitioner

## 2013-08-07 DIAGNOSIS — R102 Pelvic and perineal pain: Secondary | ICD-10-CM

## 2013-08-07 DIAGNOSIS — N921 Excessive and frequent menstruation with irregular cycle: Secondary | ICD-10-CM

## 2013-08-09 ENCOUNTER — Ambulatory Visit (HOSPITAL_COMMUNITY): Payer: Medicaid Other

## 2013-08-09 ENCOUNTER — Ambulatory Visit (HOSPITAL_COMMUNITY)
Admission: RE | Admit: 2013-08-09 | Discharge: 2013-08-09 | Disposition: A | Discharge status: Home / Self Care | Payer: Medicaid Other | Source: Ambulatory Visit | Attending: Nurse Practitioner | Admitting: Nurse Practitioner

## 2013-08-09 DIAGNOSIS — N921 Excessive and frequent menstruation with irregular cycle: Secondary | ICD-10-CM | POA: Insufficient documentation

## 2013-08-09 DIAGNOSIS — N949 Unspecified condition associated with female genital organs and menstrual cycle: Secondary | ICD-10-CM | POA: Insufficient documentation

## 2013-08-09 DIAGNOSIS — R102 Pelvic and perineal pain: Secondary | ICD-10-CM

## 2013-08-15 ENCOUNTER — Encounter (HOSPITAL_COMMUNITY): Payer: Self-pay | Admitting: Emergency Medicine

## 2013-08-15 ENCOUNTER — Emergency Department (HOSPITAL_COMMUNITY)
Admission: EM | Admit: 2013-08-15 | Discharge: 2013-08-16 | Disposition: A | Discharge status: Home / Self Care | Payer: Medicaid Other | Attending: Emergency Medicine | Admitting: Emergency Medicine

## 2013-08-15 DIAGNOSIS — Z8659 Personal history of other mental and behavioral disorders: Secondary | ICD-10-CM | POA: Insufficient documentation

## 2013-08-15 DIAGNOSIS — Z8742 Personal history of other diseases of the female genital tract: Secondary | ICD-10-CM | POA: Insufficient documentation

## 2013-08-15 DIAGNOSIS — F17200 Nicotine dependence, unspecified, uncomplicated: Secondary | ICD-10-CM | POA: Insufficient documentation

## 2013-08-15 DIAGNOSIS — J45909 Unspecified asthma, uncomplicated: Secondary | ICD-10-CM | POA: Insufficient documentation

## 2013-08-15 DIAGNOSIS — R109 Unspecified abdominal pain: Secondary | ICD-10-CM | POA: Insufficient documentation

## 2013-08-15 DIAGNOSIS — Z791 Long term (current) use of non-steroidal anti-inflammatories (NSAID): Secondary | ICD-10-CM | POA: Insufficient documentation

## 2013-08-15 DIAGNOSIS — Z9889 Other specified postprocedural states: Secondary | ICD-10-CM | POA: Insufficient documentation

## 2013-08-15 DIAGNOSIS — R1030 Lower abdominal pain, unspecified: Secondary | ICD-10-CM

## 2013-08-15 DIAGNOSIS — Z792 Long term (current) use of antibiotics: Secondary | ICD-10-CM | POA: Insufficient documentation

## 2013-08-15 DIAGNOSIS — IMO0002 Reserved for concepts with insufficient information to code with codable children: Secondary | ICD-10-CM | POA: Insufficient documentation

## 2013-08-15 DIAGNOSIS — Z8619 Personal history of other infectious and parasitic diseases: Secondary | ICD-10-CM | POA: Insufficient documentation

## 2013-08-15 DIAGNOSIS — Z79899 Other long term (current) drug therapy: Secondary | ICD-10-CM | POA: Insufficient documentation

## 2013-08-15 NOTE — ED Notes (Signed)
Pt reports she has been having lower abdominal pain for about 1 month. Pt says she was seen here for the same and had an ultrasound done on Friday, she says she has been Tylenol/Ibuprofen for pain but the meds are "just not working today"

## 2013-08-15 NOTE — ED Provider Notes (Signed)
CSN: [id number]     Arrival date & time 08/15/13  2336 History  This chart was scribed for Vida Roller, MD by Modena Jansky, ED Scribe. This patient was seen in room APA15/APA15 and the patient's care was started at 11:47 PM.   Chief Complaint  Patient presents with  . Abdominal Pain   HPI HPI Comments: Kimberly [name] is a 33 y.o. female who presents to the Emergency Department complaining of moderate intermittent lower abdominal pain that started months ago, but has recently worsened. She describes the pain as a stabbing sensation. She reports no modifying factors. She denies any dysuria.  She is known to have an IUD that is causing pain (per the pt) has had evaluation with UA and vaginal samples last week, told that she had no infections, pain worsened today but is the same as the prior pain.  No vomiting, no fevers, no diarrhea or constipation or rectal bleeding.   Past Medical History  Diagnosis Date  . Asthma   . Depression   . Abnormal Pap smear   . HPV (human papilloma virus) infection   . Chlamydia   . BV (bacterial vaginosis) 05/03/2012   Past Surgical History  Procedure Laterality Date  . None    . Colonoscopy N/A 02/20/2012    WUJ:WJXBJYNW sized internal hemorrhoids/otherwise normal  . Leep     Family History  Problem Relation Age of Onset  . Colon cancer Neg Hx   . Hypertension Other   . Diabetes Other   . Hypertension Paternal Grandmother   . Diabetes Maternal Grandmother   . Hypertension Maternal Grandmother    History  Substance Use Topics  . Smoking status: Current Some Day Smoker -- 0.10 packs/day for 10 years    Types: Cigarettes  . Smokeless tobacco: Never Used  . Alcohol Use: No   OB History   Grav Para Term Preterm Abortions TAB SAB Ect Mult Living   2 2             Review of Systems  All other systems reviewed and are negative.   Allergies  Review of patient's allergies indicates no known allergies.  Home Medications   Prior to  Admission medications   Medication Sig Start Date End Date Taking? Authorizing Provider  HYDROcodone-acetaminophen (NORCO/VICODIN) 5-325 MG per tablet Take 2 tablets by mouth every 4 (four) hours as needed for moderate pain. 08/16/13   Vida Roller, MD  hydrocortisone (ANUSOL-HC) 2.5 % rectal cream Place rectally 2 (two) times daily. USE FOR 12 DAYS 02/20/12   West Bali, MD  levonorgestrel (MIRENA) 20 MCG/24HR IUD 1 each by Intrauterine route once.    Historical Provider, MD  Linaclotide Karlene Einstein) 145 MCG CAPS Take 1 capsule (145 mcg total) by mouth daily. 02/16/12   Nira Retort, NP  metroNIDAZOLE (FLAGYL) 500 MG tablet Take 1 tablet (500 mg total) by mouth 2 (two) times daily. 05/03/12   Adline Potter, NP  naproxen (NAPROSYN) 500 MG tablet Take 1 tablet (500 mg total) by mouth 2 (two) times daily with a meal. 08/16/13   Vida Roller, MD   BP 99/52  Pulse 87  Temp(Src) 99 F (37.2 C) (Oral)  Resp 18  Ht 5\' 5"  (1.651 m)  Wt 136 lb (61.689 kg)  BMI 22.63 kg/m2  SpO2 100% Physical Exam  Nursing note and vitals reviewed. Constitutional: She is oriented to person, place, and time. She appears well-developed and well-nourished. No distress.  HENT:  Head: Normocephalic and atraumatic.  Neck: Neck supple. No tracheal deviation present.  Cardiovascular: Normal rate.   Pulmonary/Chest: Effort normal. No respiratory distress.  Abdominal: There is tenderness.  Suprapubic TTP. No adnexal tenderness. No upper abd and no RLQ ttp  Musculoskeletal: Normal range of motion. She exhibits no edema and no tenderness.  Neurological: She is alert and oriented to person, place, and time.  Skin: Skin is warm and dry.  Psychiatric: She has a normal mood and affect. Her behavior is normal.    ED Course  Procedures (including critical care time) DIAGNOSTIC STUDIES: Oxygen Saturation is 100% on RA, normal by my interpretation.    COORDINATION OF CARE: 12:07 AM- Pt advised of plan for treatment and pt  agrees.  Labs Review Labs Reviewed - No data to display  Imaging Review No results found.    MDM   Final diagnoses:  Lower abdominal pain    Well appearing, pt informed that we would not pull out IUD tonight as she has f/u with GYN, she will be given to go pack,  She is agreeable to th is disposition - no fever, no tachycardia.  I personally performed the services described in this documentation, which was scribed in my presence. The recorded information has been reviewed and is accurate.     Meds given in ED:  Medications - No data to display  New Prescriptions   HYDROCODONE-ACETAMINOPHEN (NORCO/VICODIN) 5-325 MG PER TABLET    Take 2 tablets by mouth every 4 (four) hours as needed for moderate pain.   NAPROXEN (NAPROSYN) 500 MG TABLET    Take 1 tablet (500 mg total) by mouth 2 (two) times daily with a meal.       Vida RollerBrian D Nekesha Font, MD 08/16/13 [phone]

## 2013-08-15 NOTE — ED Notes (Signed)
EDP at patient bedside for assessment/update  

## 2013-08-16 MED ORDER — NAPROXEN 500 MG PO TABS: 500.0000 mg | ORAL_TABLET | Freq: Two times a day (BID) | ORAL | Status: DC

## 2013-08-16 MED ORDER — HYDROCODONE-ACETAMINOPHEN 5-325 MG PO TABS: 2.0000 | ORAL_TABLET | ORAL | Status: DC | PRN

## 2013-08-16 NOTE — Discharge Instructions (Signed)
Please call your doctor for a followup appointment within 24-48 hours. When you talk to your doctor please let them know that you were seen in the emergency department and have them acquire all of your records so that they can discuss the findings with you and formulate a treatment plan to fully care for your new and ongoing problems. ° °

## 2013-08-19 MED FILL — Hydrocodone-Acetaminophen Tab 5-325 MG: ORAL | Qty: 6 | Status: AC

## 2013-11-11 ENCOUNTER — Encounter (HOSPITAL_COMMUNITY): Payer: Self-pay | Admitting: Emergency Medicine

## 2014-03-06 ENCOUNTER — Ambulatory Visit (INDEPENDENT_AMBULATORY_CARE_PROVIDER_SITE_OTHER): Payer: Self-pay | Admitting: Adult Health

## 2014-03-06 ENCOUNTER — Encounter: Payer: Self-pay | Admitting: Adult Health

## 2014-03-06 VITALS — BP 112/72 | HR 82 | Ht 66.0 in | Wt 138.0 lb

## 2014-03-06 DIAGNOSIS — Z3202 Encounter for pregnancy test, result negative: Secondary | ICD-10-CM

## 2014-03-06 DIAGNOSIS — N926 Irregular menstruation, unspecified: Secondary | ICD-10-CM

## 2014-03-06 HISTORY — DX: Irregular menstruation, unspecified: N92.6

## 2014-03-06 LAB — POCT URINE PREGNANCY: Preg Test, Ur: NEGATIVE

## 2014-03-06 NOTE — Progress Notes (Signed)
Subjective:     Patient ID: Kimberly [name], female   DOB: 1980/09/01, 34 y.o.   MRN: [id number]  HPI Kimberly [name] is a 34 year old black female who has IUD and she thinks it needs to be removed already.She has had some irregularity of her periods,but pretty regular lately, she took HPT and it was + x 2.She wants IUD removed and is thinking about having a baby.  She is self pay today. She had US last year and IUD in  Place, had ovarian cysts and ?adenomyosis.  Review of Systems  Patient denies any headaches, hearing loss, fatigue, blurred vision, shortness of breath, chest pain, abdominal pain, problems with bowel movements, urination, or intercourse. No joint pain or mood swings.See HPI for positives, wants to quit smoking.  Reviewed past medical,surgical, social and family history. Reviewed medications and allergies.     Objective:   Physical Exam BP 112/72 mmHg  Pulse 82  Ht 5\' 6"  (1.676 m)  Wt 138 lb (62.596 kg)  BMI 22.28 kg/m2  LMP 02/03/2014 (Approximate) UPT negative, Skin warm and dry.  Lungs: clear to ausculation bilaterally. Cardiovascular: regular rate and rhythm. Discussed applying for family planning medicaid and then get appt for pap and get IUD removed.Told her to check on price of Wellbutrin at her drug store or nicotine patches.    Assessment:     Irregular periods with IUD     Plan:     Get family planning medicaid and then call for appt to get pap and then get IUD removed Review handout on smoking cessation,call if wants to try Wellbutrin Use condoms and take prenatal vitamin

## 2014-03-06 NOTE — Patient Instructions (Addendum)
Get family planning medicaid Smoking Cessation Quitting smoking is important to your health and has many advantages. However, it is not always easy to quit since nicotine is a very addictive drug. Oftentimes, people try 3 times or more before being able to quit. This document explains the best ways for you to prepare to quit smoking. Quitting takes hard work and a lot of effort, but you can do it. ADVANTAGES OF QUITTING SMOKING  You will live longer, feel better, and live better.  Your body will feel the impact of quitting smoking almost immediately.  Within 20 minutes, blood pressure decreases. Your pulse returns to its normal level.  After 8 hours, carbon monoxide levels in the blood return to normal. Your oxygen level increases.  After 24 hours, the chance of having a heart attack starts to decrease. Your breath, hair, and body stop smelling like smoke.  After 48 hours, damaged nerve endings begin to recover. Your sense of taste and smell improve.  After 72 hours, the body is virtually free of nicotine. Your bronchial tubes relax and breathing becomes easier.  After 2 to 12 weeks, lungs can hold more air. Exercise becomes easier and circulation improves.  The risk of having a heart attack, stroke, cancer, or lung disease is greatly reduced.  After 1 year, the risk of coronary heart disease is cut in half.  After 5 years, the risk of stroke falls to the same as a nonsmoker.  After 10 years, the risk of lung cancer is cut in half and the risk of other cancers decreases significantly.  After 15 years, the risk of coronary heart disease drops, usually to the level of a nonsmoker.  If you are pregnant, quitting smoking will improve your chances of having a healthy baby.  The people you live with, especially any children, will be healthier.  You will have extra money to spend on things other than cigarettes. QUESTIONS TO THINK ABOUT BEFORE ATTEMPTING TO QUIT You may want to talk  about your answers with your health care provider.  Why do you want to quit?  If you tried to quit in the past, what helped and what did not?  What will be the most difficult situations for you after you quit? How will you plan to handle them?  Who can help you through the tough times? Your family? Friends? A health care provider?  What pleasures do you get from smoking? What ways can you still get pleasure if you quit? Here are some questions to ask your health care provider:  How can you help me to be successful at quitting?  What medicine do you think would be best for me and how should I take it?  What should I do if I need more help?  What is smoking withdrawal like? How can I get information on withdrawal? GET READY  Set a quit date.  Change your environment by getting rid of all cigarettes, ashtrays, matches, and lighters in your home, car, or work. Do not let people smoke in your home.  Review your past attempts to quit. Think about what worked and what did not. GET SUPPORT AND ENCOURAGEMENT You have a better chance of being successful if you have help. You can get support in many ways.  Tell your family, friends, and coworkers that you are going to quit and need their support. Ask them not to smoke around you.  Get individual, group, or telephone counseling and support. Programs are available at local hospitals  and health centers. Call your local health department for information about programs in your area.  Spiritual beliefs and practices may help some smokers quit.  Download a "quit meter" on your computer to keep track of quit statistics, such as how long you have gone without smoking, cigarettes not smoked, and money saved.  Get a self-help book about quitting smoking and staying off tobacco. LEARN NEW SKILLS AND BEHAVIORS  Distract yourself from urges to smoke. Talk to someone, go for a walk, or occupy your time with a task.  Change your normal routine. Take  a different route to work. Drink tea instead of coffee. Eat breakfast in a different place.  Reduce your stress. Take a hot bath, exercise, or read a book.  Plan something enjoyable to do every day. Reward yourself for not smoking.  Explore interactive web-based programs that specialize in helping you quit. GET MEDICINE AND USE IT CORRECTLY Medicines can help you stop smoking and decrease the urge to smoke. Combining medicine with the above behavioral methods and support can greatly increase your chances of successfully quitting smoking.  Nicotine replacement therapy helps deliver nicotine to your body without the negative effects and risks of smoking. Nicotine replacement therapy includes nicotine gum, lozenges, inhalers, nasal sprays, and skin patches. Some may be available over-the-counter and others require a prescription.  Antidepressant medicine helps people abstain from smoking, but how this works is unknown. This medicine is available by prescription.  Nicotinic receptor partial agonist medicine simulates the effect of nicotine in your brain. This medicine is available by prescription. Ask your health care provider for advice about which medicines to use and how to use them based on your health history. Your health care provider will tell you what side effects to look out for if you choose to be on a medicine or therapy. Carefully read the information on the package. Do not use any other product containing nicotine while using a nicotine replacement product.  RELAPSE OR DIFFICULT SITUATIONS Most relapses occur within the first 3 months after quitting. Do not be discouraged if you start smoking again. Remember, most people try several times before finally quitting. You may have symptoms of withdrawal because your body is used to nicotine. You may crave cigarettes, be irritable, feel very hungry, cough often, get headaches, or have difficulty concentrating. The withdrawal symptoms are only  temporary. They are strongest when you first quit, but they will go away within 10-14 days. To reduce the chances of relapse, try to:  Avoid drinking alcohol. Drinking lowers your chances of successfully quitting.  Reduce the amount of caffeine you consume. Once you quit smoking, the amount of caffeine in your body increases and can give you symptoms, such as a rapid heartbeat, sweating, and anxiety.  Avoid smokers because they can make you want to smoke.  Do not let weight gain distract you. Many smokers will gain weight when they quit, usually less than 10 pounds. Eat a healthy diet and stay active. You can always lose the weight gained after you quit.  Find ways to improve your mood other than smoking. FOR MORE INFORMATION  www.smokefree.gov  Document Released: 12/21/2000 Document Revised: 05/13/2013 Document Reviewed: 04/07/2011 [facility] Patient Information 2015 [place], [place]. This information is not intended to replace advice given to you by your health care provider. Make sure you discuss any questions you have with your health care provider. Take prenatal vitmains

## 2014-03-07 ENCOUNTER — Other Ambulatory Visit: Payer: Self-pay | Admitting: Adult Health

## 2014-03-07 MED ORDER — BUPROPION HCL ER (SR) 150 MG PO TB12: 150.0000 mg | ORAL_TABLET | Freq: Every day | ORAL | Status: DC

## 2014-04-09 ENCOUNTER — Emergency Department (HOSPITAL_COMMUNITY)
Admission: EM | Admit: 2014-04-09 | Discharge: 2014-04-09 | Disposition: A | Discharge status: Home / Self Care | Payer: Medicaid Other | Attending: Emergency Medicine | Admitting: Emergency Medicine

## 2014-04-09 ENCOUNTER — Encounter (HOSPITAL_COMMUNITY): Payer: Self-pay | Admitting: *Deleted

## 2014-04-09 DIAGNOSIS — S60949A Unspecified superficial injury of unspecified finger, initial encounter: Secondary | ICD-10-CM

## 2014-04-09 DIAGNOSIS — J45909 Unspecified asthma, uncomplicated: Secondary | ICD-10-CM | POA: Insufficient documentation

## 2014-04-09 DIAGNOSIS — Z79899 Other long term (current) drug therapy: Secondary | ICD-10-CM | POA: Insufficient documentation

## 2014-04-09 DIAGNOSIS — Z8619 Personal history of other infectious and parasitic diseases: Secondary | ICD-10-CM | POA: Insufficient documentation

## 2014-04-09 DIAGNOSIS — Z8719 Personal history of other diseases of the digestive system: Secondary | ICD-10-CM | POA: Insufficient documentation

## 2014-04-09 DIAGNOSIS — L089 Local infection of the skin and subcutaneous tissue, unspecified: Secondary | ICD-10-CM

## 2014-04-09 DIAGNOSIS — Y998 Other external cause status: Secondary | ICD-10-CM | POA: Insufficient documentation

## 2014-04-09 DIAGNOSIS — Y9389 Activity, other specified: Secondary | ICD-10-CM | POA: Insufficient documentation

## 2014-04-09 DIAGNOSIS — F329 Major depressive disorder, single episode, unspecified: Secondary | ICD-10-CM | POA: Insufficient documentation

## 2014-04-09 DIAGNOSIS — Z720 Tobacco use: Secondary | ICD-10-CM | POA: Insufficient documentation

## 2014-04-09 DIAGNOSIS — Y9289 Other specified places as the place of occurrence of the external cause: Secondary | ICD-10-CM | POA: Insufficient documentation

## 2014-04-09 DIAGNOSIS — X58XXXA Exposure to other specified factors, initial encounter: Secondary | ICD-10-CM | POA: Insufficient documentation

## 2014-04-09 DIAGNOSIS — S60943A Unspecified superficial injury of left middle finger, initial encounter: Secondary | ICD-10-CM | POA: Insufficient documentation

## 2014-04-09 MED ORDER — SULFAMETHOXAZOLE-TRIMETHOPRIM 800-160 MG PO TABS: 1.0000 | ORAL_TABLET | Freq: Two times a day (BID) | ORAL | Status: DC

## 2014-04-09 MED ORDER — IBUPROFEN 800 MG PO TABS
800.0000 mg | ORAL_TABLET | Freq: Once | ORAL | Status: AC
  Administered 2014-04-09: 800 mg via ORAL
  Filled 2014-04-09: qty 1

## 2014-04-09 MED ORDER — SULFAMETHOXAZOLE-TRIMETHOPRIM 800-160 MG PO TABS
1.0000 | ORAL_TABLET | Freq: Once | ORAL | Status: AC
  Administered 2014-04-09: 1 via ORAL
  Filled 2014-04-09: qty 1

## 2014-04-09 MED ORDER — IBUPROFEN 600 MG PO TABS: 600.0000 mg | ORAL_TABLET | Freq: Four times a day (QID) | ORAL | Status: DC | PRN

## 2014-04-09 NOTE — ED Notes (Signed)
Pt had her nails done on Saturday and woke up on Sunday and was barely able to put her dress on. Pt states left middle finger feels like it has a fever in it.

## 2014-04-09 NOTE — Discharge Instructions (Signed)
Fingertip Infection When an infection is around the nail, it is called a paronychia. When it appears over the tip of the finger, it is called a felon. These infections are due to minor injuries or cracks in the skin. If they are not treated properly, they can lead to bone infection and permanent damage to the fingernail. Incision and drainage is necessary if a pus pocket (an abscess) has formed. Antibiotics and pain medicine may also be needed. Keep your hand elevated for the next 2-3 days to reduce swelling and pain. If a pack was placed in the abscess, it should be removed in 1-2 days by your caregiver. Soak the finger in warm water for 20 minutes 4 times daily to help promote drainage. Keep the hands as dry as possible. Wear protective gloves with cotton liners. See your caregiver for follow-up care as recommended.  HOME CARE INSTRUCTIONS   Keep wound clean, dry and dressed as suggested by your caregiver.  Soak in warm salt water for fifteen minutes, four times per day for bacterial infections.  Your caregiver will prescribe an antibiotic if a bacterial infection is suspected. Take antibiotics as directed and finish the prescription, even if the problem appears to be improving before the medicine is gone.  Only take over-the-counter or prescription medicines for pain, discomfort, or fever as directed by your caregiver. SEEK IMMEDIATE MEDICAL CARE IF:  There is redness, swelling, or increasing pain in the wound.  Pus or any other unusual drainage is coming from the wound.  An unexplained oral temperature above 102 F (38.9 C) develops.  You notice a foul smell coming from the wound or dressing. MAKE SURE YOU:   Understand these instructions.  Monitor your condition.  Contact your caregiver if you are getting worse or not improving. Document Released: 02/04/2004 Document Revised: 03/21/2011 Document Reviewed: 01/31/2008 [facility] Patient Information 2015 [place]. This  information is not intended to replace advice given to you by your health care provider. Make sure you discuss any questions you have with your health care provider.   Take the next dose of bactrim tomorrow morning.  Soak your finger in warm water for 15 minutes three times daily.  See your doctor for a recheck in 2 days if this is not starting to improve or you develop worsen pain or swelling.

## 2014-04-10 NOTE — ED Provider Notes (Signed)
CSN: [id number]     Arrival date & time 04/09/14  2029 History   First MD Initiated Contact with Patient 04/09/14 2053     No chief complaint on file.    (Consider location/radiation/quality/duration/timing/severity/associated sxs/prior Treatment) The history is provided by the patient.   Kimberly [name] is a 34 y.o. female presenting with pain and redness in her left long distal finger which started 3 days ago, a day after she had her regularly scheduled gel nail fill in appointment.  The finger tip is tender with palpation, red and feels "like it has fever".  She denies drainage from around the nail, denies radiation of pain, fevers or other complaint.  She has had no treatments for this condition.     Past Medical History  Diagnosis Date  . Asthma   . Depression   . Abnormal Pap smear   . HPV (human papilloma virus) infection   . Chlamydia   . BV (bacterial vaginosis) 05/03/2012  . Vaginal Pap smear, abnormal   . Irregular periods 03/06/2014   Past Surgical History  Procedure Laterality Date  . None    . Colonoscopy N/A 02/20/2012    WUJ:WJXBJYNWSLF:Moderate sized internal hemorrhoids/otherwise normal  . Leep     Family History  Problem Relation Age of Onset  . Colon cancer Neg Hx   . Hypertension Paternal Grandmother   . Diabetes Maternal Grandmother   . Hypertension Maternal Grandmother   . Diabetes Maternal Grandfather   . Hypertension Mother   . Diabetes Maternal Uncle   . Diabetes Maternal Uncle    History  Substance Use Topics  . Smoking status: Current Every Day Smoker -- 0.50 packs/day for 10 years    Types: Cigarettes  . Smokeless tobacco: Never Used  . Alcohol Use: No   OB History    Gravida Para Term Preterm AB TAB SAB Ectopic Multiple Living   2 2             Review of Systems  Constitutional: Negative for fever and chills.  Respiratory: Negative for shortness of breath and wheezing.   Musculoskeletal: Positive for arthralgias.  Skin: Positive for color  change. Negative for wound.  Neurological: Negative for numbness.      Allergies  Review of patient's allergies indicates no known allergies.  Home Medications   Prior to Admission medications   Medication Sig Start Date End Date Taking? Authorizing Provider  buPROPion (WELLBUTRIN SR) 150 MG 12 hr tablet Take 1 tablet (150 mg total) by mouth daily. 03/07/14  Yes Adline PotterJennifer A Griffin, NP  ibuprofen (ADVIL,MOTRIN) 600 MG tablet Take 1 tablet (600 mg total) by mouth every 6 (six) hours as needed. 04/09/14   Burgess AmorJulie Wagner Tanzi, PA-C  sulfamethoxazole-trimethoprim (SEPTRA DS) 800-160 MG per tablet Take 1 tablet by mouth every 12 (twelve) hours. 04/09/14   Burgess AmorJulie Deronte Solis, PA-C   BP 126/79 mmHg  Pulse 94  Temp(Src) 98 F (36.7 C) (Oral)  Resp 20  Ht 5\' 6"  (1.676 m)  Wt 139 lb (63.05 kg)  BMI 22.45 kg/m2  SpO2 100% Physical Exam  Constitutional: She appears well-developed and well-nourished. No distress.  HENT:  Head: Normocephalic.  Neck: Neck supple.  Cardiovascular: Normal rate.   Pulmonary/Chest: Effort normal. She has no wheezes.  Musculoskeletal: Normal range of motion. She exhibits edema and tenderness.  Soft edema and erythema of left long finger isolated to distal phalanx.  No induration, no pustules.  Skin: Rash noted.    ED Course  Procedures (including  critical care time) Labs Review Labs Reviewed - No data to display  Imaging Review No results found.   EKG Interpretation None      MDM   Final diagnoses:  Injury, superficial, finger with infection, initial encounter    Pt advised warm soaks, ibuprofen prescribed for pain, placed on bactrim.  Recheck by pcp in 2 days if sx are not responding to this tx or for any worsened sx.  No evidence of paronychia or felon on exam today.    Burgess Amor, PA-C 04/10/14 1353  Layla Maw Ward, DO 04/11/14 2329

## 2014-04-14 ENCOUNTER — Encounter (HOSPITAL_COMMUNITY): Payer: Self-pay | Admitting: Emergency Medicine

## 2014-04-14 ENCOUNTER — Emergency Department (HOSPITAL_COMMUNITY): Payer: Self-pay

## 2014-04-14 ENCOUNTER — Emergency Department (HOSPITAL_COMMUNITY)
Admission: EM | Admit: 2014-04-14 | Discharge: 2014-04-14 | Disposition: A | Discharge status: Home / Self Care | Payer: Self-pay | Attending: Emergency Medicine | Admitting: Emergency Medicine

## 2014-04-14 DIAGNOSIS — F329 Major depressive disorder, single episode, unspecified: Secondary | ICD-10-CM | POA: Insufficient documentation

## 2014-04-14 DIAGNOSIS — L02512 Cutaneous abscess of left hand: Secondary | ICD-10-CM

## 2014-04-14 DIAGNOSIS — L03012 Cellulitis of left finger: Secondary | ICD-10-CM | POA: Insufficient documentation

## 2014-04-14 DIAGNOSIS — Z8619 Personal history of other infectious and parasitic diseases: Secondary | ICD-10-CM | POA: Insufficient documentation

## 2014-04-14 DIAGNOSIS — Z8742 Personal history of other diseases of the female genital tract: Secondary | ICD-10-CM | POA: Insufficient documentation

## 2014-04-14 DIAGNOSIS — Z720 Tobacco use: Secondary | ICD-10-CM | POA: Insufficient documentation

## 2014-04-14 DIAGNOSIS — J45909 Unspecified asthma, uncomplicated: Secondary | ICD-10-CM | POA: Insufficient documentation

## 2014-04-14 DIAGNOSIS — Z79899 Other long term (current) drug therapy: Secondary | ICD-10-CM | POA: Insufficient documentation

## 2014-04-14 MED ORDER — LIDOCAINE HCL (PF) 1 % IJ SOLN
INTRAMUSCULAR | Status: AC
  Administered 2014-04-14: 20:00:00
  Filled 2014-04-14: qty 5

## 2014-04-14 MED ORDER — DOXYCYCLINE HYCLATE 100 MG PO TABS
ORAL_TABLET | ORAL | Status: AC
  Administered 2014-04-14: 100 mg
  Filled 2014-04-14: qty 1

## 2014-04-14 MED ORDER — CEFTRIAXONE SODIUM 1 G IJ SOLR
INTRAMUSCULAR | Status: AC
  Filled 2014-04-14: qty 10

## 2014-04-14 MED ORDER — OXYCODONE-ACETAMINOPHEN 5-325 MG PO TABS
ORAL_TABLET | ORAL | Status: AC
  Filled 2014-04-14: qty 1

## 2014-04-14 MED ORDER — BUPIVACAINE HCL (PF) 0.25 % IJ SOLN
INTRAMUSCULAR | Status: AC
  Administered 2014-04-14: 19:00:00
  Filled 2014-04-14: qty 30

## 2014-04-14 MED ORDER — CEFTRIAXONE SODIUM 1 G IJ SOLR
1.0000 g | Freq: Once | INTRAMUSCULAR | Status: AC
  Administered 2014-04-14: 1 g via INTRAMUSCULAR

## 2014-04-14 MED ORDER — OXYCODONE-ACETAMINOPHEN 5-325 MG PO TABS
1.0000 | ORAL_TABLET | Freq: Once | ORAL | Status: AC
  Administered 2014-04-14: 1 via ORAL

## 2014-04-14 MED ORDER — CEPHALEXIN 500 MG PO CAPS: 500.0000 mg | ORAL_CAPSULE | Freq: Four times a day (QID) | ORAL | Status: DC

## 2014-04-14 MED ORDER — DOXYCYCLINE HYCLATE 100 MG PO CAPS: 100.0000 mg | ORAL_CAPSULE | Freq: Two times a day (BID) | ORAL | Status: DC

## 2014-04-14 MED ORDER — LIDOCAINE HCL (PF) 1 % IJ SOLN
INTRAMUSCULAR | Status: AC
  Administered 2014-04-14: 19:00:00
  Filled 2014-04-14: qty 5

## 2014-04-14 MED ORDER — OXYCODONE-ACETAMINOPHEN 5-325 MG PO TABS: 2.0000 | ORAL_TABLET | ORAL | Status: DC | PRN

## 2014-04-14 NOTE — ED Notes (Signed)
Kimberly [name] has been in room mult times attempting to block pts finger.  Pt alert,

## 2014-04-14 NOTE — ED Provider Notes (Addendum)
Medical screening examination/treatment/procedure(s) were conducted as a shared visit with non-physician practitioner(s) and myself.  I personally evaluated the patient during the encounter.   EKG Interpretation None      Results for orders placed or performed in visit on 03/06/14  POCT urine pregnancy  Result Value Ref Range   Preg Test, Ur Negative    Dg Finger Middle Left  04/14/2014   CLINICAL DATA:  Left hand middle finger pain in the nail bed since 04/05/2014. Still taking antibiotics prescribed in the ED on 03/13/2014.  EXAM: LEFT MIDDLE FINGER 2+V  COMPARISON:  None.  FINDINGS: There is soft tissue swelling in the dorsal distal aspect of the digit, not associated with cortical erosion, soft tissue gas, or acute fracture.  IMPRESSION: 1. Soft tissue swelling. 2. No evidence for acute osseous abnormality.   Electronically Signed   By: Norva PavlovElizabeth  Brown M.D.   On: 04/14/2014 15:01    Patient seen by me. Patient with a paronychia will type infection to the left middle finger around the nail bed and involving the pulp of the finger. Patient's been on Septra for the past week. X-rays today show no bony problems. Hard to determine exactly where the pluses. Recommended finger block and then probing with a needle to see if a pus pocket can be found. Most likely straightforward paronychia.  Patient's been on Septra may need to be changed to doxycycline and Keflex. Also patient was instructed to soak in warm water but has been soaking and hydroperoxide alcohol for the most part which would do exactly the opposite of what we wanted to sit most moisturizing the skin with dry it out. There's been no discharge of pus. The fingertip does throb. Does not involve the proximal part of the finger.   I feel at this time hand consultation is not required.  Vanetta MuldersScott Aariyah Sampey, MD 04/14/14 1749  Addendum: Mid-level was able to get pus extracted from the side of the finger and underneath the nail bed. Patient  will be started on Keflex and doxycycline will soak finger and has follow-up tomorrow for reevaluation.    Vanetta MuldersScott Johathan Province, MD 04/14/14 [phone]

## 2014-04-14 NOTE — ED Provider Notes (Signed)
CSN: [id number]     Arrival date & time 04/14/14  1354 History  This chart was scribed for Kerrie Buffalo, NP with Vanetta Mulders, MD by Tonye Royalty, ED Scribe. This patient was seen in room [facility] and the patient's care was started at 2:30 PM.    Chief Complaint  Patient presents with  . Hand Pain   Patient is a 34 y.o. female presenting with hand pain. The history is provided by the patient. No language interpreter was used.  Hand Pain This is a new problem. The current episode started more than 1 week ago. The problem occurs constantly. The problem has been gradually worsening. Pertinent negatives include no shortness of breath. Nothing aggravates the symptoms. The symptoms are relieved by ice. Treatments tried: antibiotics. The treatment provided no relief.    HPI Comments: Kimberly [name] is a 34 y.o. female who presents to the Emergency Department complaining of pain to left middle finger. She states she had a manicure 9 days ago. A couple days later her finger was swollen and painful. She was evaluated here 5 days ago prescribed bactrim; she states symptoms have been getting worse. She states she got the acrylic off and notes drainage. She has been soaking with peroxide and applying ice.   Past Medical History  Diagnosis Date  . Asthma   . Depression   . Abnormal Pap smear   . HPV (human papilloma virus) infection   . Chlamydia   . BV (bacterial vaginosis) 05/03/2012  . Vaginal Pap smear, abnormal   . Irregular periods 03/06/2014   Past Surgical History  Procedure Laterality Date  . None    . Colonoscopy N/A 02/20/2012    WUJ:WJXBJYNW sized internal hemorrhoids/otherwise normal  . Leep     Family History  Problem Relation Age of Onset  . Colon cancer Neg Hx   . Hypertension Paternal Grandmother   . Diabetes Maternal Grandmother   . Hypertension Maternal Grandmother   . Diabetes Maternal Grandfather   . Hypertension Mother   . Diabetes Maternal Uncle   . Diabetes  Maternal Uncle    History  Substance Use Topics  . Smoking status: Current Every Day Smoker -- 0.50 packs/day for 10 years    Types: Cigarettes  . Smokeless tobacco: Never Used  . Alcohol Use: No   OB History    Gravida Para Term Preterm AB TAB SAB Ectopic Multiple Living   2 2             Review of Systems  Respiratory: Negative for shortness of breath.   Skin:       Left middle finger pain with drainage  All other systems reviewed and are negative.     Allergies  Review of patient's allergies indicates no known allergies.  Home Medications   Prior to Admission medications   Medication Sig Start Date End Date Taking? Authorizing Provider  buPROPion (WELLBUTRIN SR) 150 MG 12 hr tablet Take 1 tablet (150 mg total) by mouth daily. 03/07/14  Yes Adline Potter, NP  ibuprofen (ADVIL,MOTRIN) 600 MG tablet Take 1 tablet (600 mg total) by mouth every 6 (six) hours as needed. Patient taking differently: Take 600 mg by mouth every 6 (six) hours as needed for mild pain.  04/09/14  Yes Burgess Amor, PA-C  sulfamethoxazole-trimethoprim (SEPTRA DS) 800-160 MG per tablet Take 1 tablet by mouth every 12 (twelve) hours. 04/09/14  Yes Burgess Amor, PA-C  cephALEXin (KEFLEX) 500 MG capsule Take 1 capsule (500 mg  total) by mouth 4 (four) times daily. 04/14/14   Mihran Lebarron Orlene OchM Dawsen Krieger, NP  doxycycline (VIBRAMYCIN) 100 MG capsule Take 1 capsule (100 mg total) by mouth 2 (two) times daily. 04/14/14   Emmi Wertheim Orlene OchM Kambre Messner, NP  oxyCODONE-acetaminophen (PERCOCET/ROXICET) 5-325 MG per tablet Take 2 tablets by mouth every 4 (four) hours as needed for severe pain. 04/14/14   Oretha Weismann Orlene OchM Farid Grigorian, NP   BP 127/76 mmHg  Pulse 91  Temp(Src) 98.3 F (36.8 C) (Oral)  Resp 14  Ht 5\' 6"  (1.676 m)  Wt 139 lb (63.05 kg)  BMI 22.45 kg/m2  SpO2 100%  LMP 03/18/2014 Physical Exam  Constitutional: She is oriented to person, place, and time. She appears well-developed and well-nourished.  HENT:  Head: Normocephalic and atraumatic.  Eyes:  Conjunctivae are normal.  Neck: Normal range of motion. Neck supple.  Cardiovascular: Normal rate.   Pulmonary/Chest: Effort normal.  Musculoskeletal:       Hands: Tenderness and swelling to the distal aspect of the left middle finger. Appears to have pus under the skin surrounding the nail and there is tenderness and swelling to the tip of the finger.  Radial pulse 2+, adequate circulation, limited range of motion at DIP due to pain.   Neurological: She is alert and oriented to person, place, and time.  Skin: Skin is warm and dry. There is erythema (middle finger left).  Psychiatric: She has a normal mood and affect. Her behavior is normal.  Nursing note and vitals reviewed.   ED Course  Procedures (including critical care time)  DIAGNOSTIC STUDIES: Oxygen Saturation is 100% on room air, normal by my interpretation.    COORDINATION OF CARE: 2:34 PM Discussed treatment plan with patient at beside, the patient agrees with the plan and has no further questions at this time.  Dg Finger Middle Left  04/14/2014   CLINICAL DATA:  Left hand middle finger pain in the nail bed since 04/05/2014. Still taking antibiotics prescribed in the ED on 03/13/2014.  EXAM: LEFT MIDDLE FINGER 2+V  COMPARISON:  None.  FINDINGS: There is soft tissue swelling in the dorsal distal aspect of the digit, not associated with cortical erosion, soft tissue gas, or acute fracture.  IMPRESSION: 1. Soft tissue swelling. 2. No evidence for acute osseous abnormality.   Electronically Signed   By: Norva PavlovElizabeth  Brown M.D.   On: 04/14/2014 15:01    Discussed with Dr. Freida BusmanAllen and he examined the patient. Dr. Freida BusmanAllen thinks there may be infection under the nail.  Request hand consult.   15:45 pm Consult with on call hand surgeon, Dr. Janee Mornhompson. He request that either the EDP or ortho call at [facility] take care of the hand.   Consult with Dr. Romeo AppleHarrison, ortho on call for [facility], he request that I do a digital block for further assessment and  treatment.  He feels that from the description the nail may need to be removed to allow the infection to drain. He feels that the procedure can be done in the ED.    Discussed with Dr. Deretha EmoryZackowski who is now the ED attending since Dr. Freida BusmanAllen has gone for the day. Dr. Deretha EmoryZackowski in to examine the patient. He discussed with me doing a digital block and draining the area of the paronychia and fingertip. After that he will re assess.   Digital block with lidocaine and Sensorcaine. Patient continues to complain of pain at the nail bed.   Second digital block.   At this point I am able to  make incision at the fingertip and purulent drainage expelled. Purulent drainage also expelled from crack in the midportion of the nail. Incision made to area to the side of the nail with only blood expelled. Patient continues to complain of pain. Using hemostat attempt to pull the nail and patient began to cry and states she can no tolerate the procedure.   Rocephin 1 gram IM, Doxycycline 100 mg PO, Percocet 5/325 mg PO.   When I went back in the exam room the patient had scissors and was trying to cut under the nail. She states she is trying to remove it because it doesn't hurt as much when she does it on herself.   I discussed with Dr. Deretha Emory that patient continues to complain of pain and I am unable to remove the nail. Using cautery stick hole made at the base of the nail and small amount of drainage noted. Dr. Deretha Emory wants patient to go home and continue warm soaks with antibacterial soap. Continue antibiotics and return to the ED tomorrow for recheck.     MDM  34 y.o. female with pain and swelling to the left middle finger x 1 week. Stable for d/c to return for recheck tomorrow.   See Dr. Darlyn Chamber note Final diagnoses:  Felon, left   I personally performed the services described in this documentation, which was scribed in my presence. The recorded information has been reviewed and is accurate.   [place], [place] 04/14/14 2017

## 2014-04-14 NOTE — ED Notes (Signed)
PT c/o left hand middle finger nailbed pain since 04/05/14 and states still taking antibiotics that were prescribed by the ED on 04/09/14.

## 2014-04-14 NOTE — ED Notes (Signed)
Pt cont to have pain to finger. New  Suture tray set up for another  Care giver to attempt to block the finger.

## 2014-04-14 NOTE — Discharge Instructions (Signed)
Soak your finger in warm water with antibacterial soap. Take the medications as directed. Return tomorrow for recheck. Do not take the narcotic if you are driving.

## 2014-04-14 NOTE — ED Notes (Signed)
LMF, distal phalanx swelling with redness  Has been taking antibiotic Seen here for same.

## 2014-04-14 NOTE — ED Notes (Signed)
NP talking with ortho by phone

## 2014-04-15 ENCOUNTER — Emergency Department (HOSPITAL_COMMUNITY)
Admission: EM | Admit: 2014-04-15 | Discharge: 2014-04-15 | Disposition: A | Discharge status: Home / Self Care | Payer: Self-pay | Attending: Emergency Medicine | Admitting: Emergency Medicine

## 2014-04-15 ENCOUNTER — Encounter (HOSPITAL_COMMUNITY): Payer: Self-pay | Admitting: Emergency Medicine

## 2014-04-15 DIAGNOSIS — Z79899 Other long term (current) drug therapy: Secondary | ICD-10-CM | POA: Insufficient documentation

## 2014-04-15 DIAGNOSIS — J45909 Unspecified asthma, uncomplicated: Secondary | ICD-10-CM | POA: Insufficient documentation

## 2014-04-15 DIAGNOSIS — Z4801 Encounter for change or removal of surgical wound dressing: Secondary | ICD-10-CM | POA: Insufficient documentation

## 2014-04-15 DIAGNOSIS — Z720 Tobacco use: Secondary | ICD-10-CM | POA: Insufficient documentation

## 2014-04-15 DIAGNOSIS — Z8742 Personal history of other diseases of the female genital tract: Secondary | ICD-10-CM | POA: Insufficient documentation

## 2014-04-15 DIAGNOSIS — Z8619 Personal history of other infectious and parasitic diseases: Secondary | ICD-10-CM | POA: Insufficient documentation

## 2014-04-15 DIAGNOSIS — Z5189 Encounter for other specified aftercare: Secondary | ICD-10-CM

## 2014-04-15 DIAGNOSIS — F329 Major depressive disorder, single episode, unspecified: Secondary | ICD-10-CM | POA: Insufficient documentation

## 2014-04-15 DIAGNOSIS — Z792 Long term (current) use of antibiotics: Secondary | ICD-10-CM | POA: Insufficient documentation

## 2014-04-15 NOTE — Discharge Instructions (Signed)
Wound Care Wound care helps prevent pain and infection.  You may need a tetanus shot if:  You cannot remember when you had your last tetanus shot.  You have never had a tetanus shot.  The injury broke your skin. If you need a tetanus shot and you choose not to have one, you may get tetanus. Sickness from tetanus can be serious. HOME CARE   Only take medicine as told by your doctor.  Clean the wound daily with mild soap and water.  Change any bandages (dressings) as told by your doctor.  Put medicated cream and a bandage on the wound as told by your doctor.  Change the bandage if it gets wet, dirty, or starts to smell.  Take showers. Do not take baths, swim, or do anything that puts your wound under water.  Rest and raise (elevate) the wound until the pain and puffiness (swelling) are better.  Keep all doctor visits as told. GET HELP RIGHT AWAY IF:   Yellowish-white fluid (pus) comes from the wound.  Medicine does not lessen your pain.  There is a red streak going away from the wound.  You have a fever. MAKE SURE YOU:   Understand these instructions.  Will watch your condition.  Will get help right away if you are not doing well or get worse. Document Released: 10/06/2007 Document Revised: 03/21/2011 Document Reviewed: 05/02/2010 [facility] Patient Information 2015 [place]. This information is not intended to replace advice given to you by your health care provider. Make sure you discuss any questions you have with your health care provider.  It is important for you to get your antibiotics filled and start taking today.  Also, you need to use a warm water epsom salt soak 3 times daily for 10 minutes, then dry completely before reapplying the dressing.  Return here in 2 days for a recheck if the swelling and pain has not continued to improve, or for any worsened symptoms.

## 2014-04-15 NOTE — ED Notes (Addendum)
Patient was told to come back today for recheck of wound on left middle finger. Patient states she took percocet at 0930 this morning.

## 2014-04-15 NOTE — ED Provider Notes (Signed)
CSN: [id number]     Arrival date & time 04/15/14  1106 History   First MD Initiated Contact with Patient 04/15/14 1203     Chief Complaint  Patient presents with  . Wound Check     (Consider location/radiation/quality/duration/timing/severity/associated sxs/prior Treatment) The history is provided by the patient.   Kimberly [name] is a 34 y.o. female presenting for a 24 hour recheck of her distal left long finger paronychia and fingertip infection (felt to be complication from manicure and gel nail application last week) which was I & D'd along with nail trephination yesterday.  Patient reports significant improvement in pain and swelling. She has completed several warm water soaks using antibacterial soap.  She denies continued drainage from the finger.  She has not yet picked up her new antibiotics but will do this when she leaves here.  She denies fevers or chills and any other complaints at this time.     Past Medical History  Diagnosis Date  . Asthma   . Depression   . Abnormal Pap smear   . HPV (human papilloma virus) infection   . Chlamydia   . BV (bacterial vaginosis) 05/03/2012  . Vaginal Pap smear, abnormal   . Irregular periods 03/06/2014   Past Surgical History  Procedure Laterality Date  . None    . Colonoscopy N/A 02/20/2012    WUJ:WJXBJYNW sized internal hemorrhoids/otherwise normal  . Leep     Family History  Problem Relation Age of Onset  . Colon cancer Neg Hx   . Hypertension Paternal Grandmother   . Diabetes Maternal Grandmother   . Hypertension Maternal Grandmother   . Diabetes Maternal Grandfather   . Hypertension Mother   . Diabetes Maternal Uncle   . Diabetes Maternal Uncle    History  Substance Use Topics  . Smoking status: Current Every Day Smoker -- 0.50 packs/day for 10 years    Types: Cigarettes  . Smokeless tobacco: Never Used  . Alcohol Use: No   OB History    Gravida Para Term Preterm AB TAB SAB Ectopic Multiple Living   2 2              Review of Systems  Constitutional: Negative for fever and chills.  Skin: Positive for wound.  Neurological: Negative for numbness.      Allergies  Review of patient's allergies indicates no known allergies.  Home Medications   Prior to Admission medications   Medication Sig Start Date End Date Taking? Authorizing Provider  buPROPion (WELLBUTRIN SR) 150 MG 12 hr tablet Take 1 tablet (150 mg total) by mouth daily. 03/07/14  Yes Adline Potter, NP  ibuprofen (ADVIL,MOTRIN) 600 MG tablet Take 1 tablet (600 mg total) by mouth every 6 (six) hours as needed. Patient taking differently: Take 600 mg by mouth every 6 (six) hours as needed for mild pain.  04/09/14  Yes Burgess Amor, PA-C  oxyCODONE-acetaminophen (PERCOCET/ROXICET) 5-325 MG per tablet Take 2 tablets by mouth every 4 (four) hours as needed for severe pain. 04/14/14  Yes Hope Orlene Och, NP  cephALEXin (KEFLEX) 500 MG capsule Take 1 capsule (500 mg total) by mouth 4 (four) times daily. Patient not taking: Reported on 04/15/2014 04/14/14   Janne Napoleon, NP  doxycycline (VIBRAMYCIN) 100 MG capsule Take 1 capsule (100 mg total) by mouth 2 (two) times daily. Patient not taking: Reported on 04/15/2014 04/14/14   Janne Napoleon, NP  sulfamethoxazole-trimethoprim (SEPTRA DS) 800-160 MG per tablet Take 1 tablet by  mouth every 12 (twelve) hours. Patient not taking: Reported on 04/15/2014 04/09/14   Burgess AmorJulie Selin Eisler, PA-C   BP 115/79 mmHg  Pulse 89  Temp(Src) 98.8 F (37.1 C) (Oral)  Resp 20  Ht 5\' 6"  (1.676 m)  Wt 132 lb (59.875 kg)  BMI 21.32 kg/m2  SpO2 100%  LMP 03/18/2014 Physical Exam  Constitutional: She appears well-developed and well-nourished. No distress.  HENT:  Head: Normocephalic.  Cardiovascular: Normal rate.   Musculoskeletal: Normal range of motion. She exhibits no edema.  Skin:  Trace swelling of left distal long finger, no erythema, no pus pocket or active drainage.  Proximal nail plate present, distal plate has been  removed.trephination hole appears patent, no active drainage. No fluctuance.      ED Course  Procedures (including critical care time) Labs Review Labs Reviewed - No data to display  Imaging Review Dg Finger Middle Left  04/14/2014   CLINICAL DATA:  Left hand middle finger pain in the nail bed since 04/05/2014. Still taking antibiotics prescribed in the ED on 03/13/2014.  EXAM: LEFT MIDDLE FINGER 2+V  COMPARISON:  None.  FINDINGS: There is soft tissue swelling in the dorsal distal aspect of the digit, not associated with cortical erosion, soft tissue gas, or acute fracture.  IMPRESSION: 1. Soft tissue swelling. 2. No evidence for acute osseous abnormality.   Electronically Signed   By: Norva PavlovElizabeth  Brown M.D.   On: 04/14/2014 15:01     EKG Interpretation None      MDM   Final diagnoses:  Wound check, abscess    Wound appears improved by patient report and per documentation form last nights visit. Pt was strongly encouraged to get her prescriptions of doxy and keflex.  She is proceeding to the pharmacy when she leaves here. Encouraged to continue warm water soaks, dry completely then cover with new dressing.  Advised recheck here in 2 additional days for recheck, sooner for any worsened sx.  Pt agrees with plan.    Burgess AmorJulie Catherene Kaleta, PA-C 04/15/14 2252  Eber HongBrian Miller, MD 04/16/14 [phone]

## 2014-04-22 MED FILL — Oxycodone w/ Acetaminophen Tab 5-325 MG: ORAL | Qty: 6 | Status: AC

## 2015-05-26 ENCOUNTER — Telehealth: Payer: Self-pay | Admitting: *Deleted

## 2015-05-26 NOTE — Telephone Encounter (Signed)
Spoke with pt. Pt has been off of Mirena over 1 year. She has not gotten pregnant yet. I advised she would need to schedule an appt to discuss. Pt to call back tomorrow to schedule appt (computers were turned off up front). JSY

## 2015-06-18 ENCOUNTER — Telehealth: Payer: Self-pay | Admitting: Gastroenterology

## 2015-06-18 NOTE — Telephone Encounter (Signed)
Pt called this morning asking to be seen today. I told her that I could make her an appointment and we don't do walk ins. She said that she has lost about 10 pounds in a week and then noticed worms in the toilet after having a BM. She doesn't know what to do. I offered again to make her an OV, but she wanted to know if there was something OTC she could take. Please advise and call [phone]

## 2015-06-18 NOTE — Telephone Encounter (Signed)
We have not seen the pt since 2014. She will either need an ov or she needs to see her pcp.

## 2015-06-19 NOTE — Telephone Encounter (Signed)
OV made and appt card mailed °

## 2015-07-10 ENCOUNTER — Encounter: Payer: Self-pay | Admitting: Gastroenterology

## 2015-07-10 ENCOUNTER — Telehealth: Payer: Self-pay | Admitting: Gastroenterology

## 2015-07-10 ENCOUNTER — Ambulatory Visit: Payer: Self-pay | Admitting: Gastroenterology

## 2015-07-10 NOTE — Telephone Encounter (Signed)
PATIENT WAS A NO SHOW AND LETTER SENT  °

## 2015-08-12 ENCOUNTER — Encounter (HOSPITAL_COMMUNITY): Payer: Self-pay | Admitting: Emergency Medicine

## 2015-08-12 ENCOUNTER — Emergency Department (HOSPITAL_COMMUNITY)
Admission: EM | Admit: 2015-08-12 | Discharge: 2015-08-13 | Disposition: A | Discharge status: Home / Self Care | Payer: Self-pay | Attending: Emergency Medicine | Admitting: Emergency Medicine

## 2015-08-12 DIAGNOSIS — F17210 Nicotine dependence, cigarettes, uncomplicated: Secondary | ICD-10-CM | POA: Insufficient documentation

## 2015-08-12 DIAGNOSIS — R195 Other fecal abnormalities: Secondary | ICD-10-CM | POA: Insufficient documentation

## 2015-08-12 DIAGNOSIS — N730 Acute parametritis and pelvic cellulitis: Secondary | ICD-10-CM

## 2015-08-12 DIAGNOSIS — N739 Female pelvic inflammatory disease, unspecified: Secondary | ICD-10-CM | POA: Insufficient documentation

## 2015-08-12 DIAGNOSIS — R112 Nausea with vomiting, unspecified: Secondary | ICD-10-CM | POA: Insufficient documentation

## 2015-08-12 DIAGNOSIS — R109 Unspecified abdominal pain: Secondary | ICD-10-CM

## 2015-08-12 DIAGNOSIS — R102 Pelvic and perineal pain: Secondary | ICD-10-CM | POA: Insufficient documentation

## 2015-08-12 LAB — COMPREHENSIVE METABOLIC PANEL
ALK PHOS: 56 U/L (ref 38–126)
ALT: 14 U/L (ref 14–54)
AST: 30 U/L (ref 15–41)
Albumin: 4.3 g/dL (ref 3.5–5.0)
Anion gap: 6 (ref 5–15)
BILIRUBIN TOTAL: 0.9 mg/dL (ref 0.3–1.2)
BUN: 8 mg/dL (ref 6–20)
CALCIUM: 8.5 mg/dL — AB (ref 8.9–10.3)
CO2: 25 mmol/L (ref 22–32)
Chloride: 101 mmol/L (ref 101–111)
Creatinine, Ser: 0.83 mg/dL (ref 0.44–1.00)
GFR calc Af Amer: >60 mL/min (ref >60)
GFR calc non Af Amer: >60 mL/min (ref >60)
Glucose, Bld: 133 mg/dL — ABNORMAL HIGH (ref 65–99)
POTASSIUM: 4.8 mmol/L (ref 3.5–5.1)
Sodium: 132 mmol/L — ABNORMAL LOW (ref 135–145)
Total Protein: 7.4 g/dL (ref 6.5–8.1)

## 2015-08-12 LAB — HCG, QUANTITATIVE, PREGNANCY: hCG, Beta Chain, Quant, S: <1 m[IU]/mL (ref <5)

## 2015-08-12 LAB — CBC
HEMATOCRIT: 39.3 % (ref 36.0–46.0)
HEMOGLOBIN: 13.4 g/dL (ref 12.0–15.0)
MCH: 28.4 pg (ref 26.0–34.0)
MCHC: 34.1 g/dL (ref 30.0–36.0)
MCV: 83.3 fL (ref 78.0–100.0)
Platelets: 238 10*3/uL (ref 150–400)
RBC: 4.72 MIL/uL (ref 3.87–5.11)
RDW: 12.6 % (ref 11.5–15.5)
WBC: 11.1 10*3/uL — AB (ref 4.0–10.5)

## 2015-08-12 LAB — LIPASE, BLOOD: Lipase: 23 U/L (ref 11–51)

## 2015-08-12 LAB — POC URINE PREG, ED: PREG TEST UR: NEGATIVE

## 2015-08-12 MED ORDER — SODIUM CHLORIDE 0.9 % IV BOLUS (SEPSIS)
1000.0000 mL | Freq: Once | INTRAVENOUS | Status: AC
  Administered 2015-08-12: 1000 mL via INTRAVENOUS

## 2015-08-12 MED ORDER — MORPHINE SULFATE (PF) 4 MG/ML IV SOLN
4.0000 mg | Freq: Once | INTRAVENOUS | Status: AC
  Administered 2015-08-12: 4 mg via INTRAVENOUS
  Filled 2015-08-12: qty 1

## 2015-08-12 MED ORDER — ONDANSETRON HCL 4 MG/2ML IJ SOLN
4.0000 mg | Freq: Once | INTRAMUSCULAR | Status: AC
  Administered 2015-08-12: 4 mg via INTRAVENOUS
  Filled 2015-08-12: qty 2

## 2015-08-12 NOTE — ED Notes (Signed)
Pt now states that the pain is more localized on left side of abd area,

## 2015-08-12 NOTE — ED Notes (Signed)
Pt c/o generalized abd pain with n/v that started yesterday along with rectal pain and blood in her stool, last bowel movement was today,

## 2015-08-12 NOTE — ED Notes (Signed)
Asked pt if she could urinate and pt stated she was in too much pain to move. I told her we needed a sample when she could go.

## 2015-08-12 NOTE — ED Triage Notes (Signed)
Pt c/o severe abd pain and vomiting that started tonight.

## 2015-08-12 NOTE — ED Notes (Signed)
Pt refuses to allow lab to recollect on blood work due to being in too much pain,

## 2015-08-12 NOTE — ED Provider Notes (Signed)
[facility] DEPT Provider Note   CSN: [id number] Arrival date & time: 08/12/15  2124  First Provider Contact:  None       History   Chief Complaint Chief Complaint  Patient presents with  . Emesis    HPI MALAKA RUFFNER is a 35 y.o. female.  HPI   Pt is a 35 y/o female who presents to the ED with progressively worsening abdominal pain since yesterday. Pt states yesterday the pain was intermittent until today the pain became constant. It waxes and wanes and at it's worst it is a 10/10. It is located in the periumbilical region and radiated down to her suprapubic region. Pt states her LMP was roughly one week ago. Pt states associated N, V today. She states she had a normal BM today.One episode of hematochezia 2 weeks ago with a history of internal hemorrhoids. She denies hematemesis, fever, chills, flank pain, dysuria, hematuria, vaginal discharge.    Past Medical History:  Diagnosis Date  . Abnormal Pap smear   . Asthma   . BV (bacterial vaginosis) 05/03/2012  . Chlamydia   . Depression   . HPV (human papilloma virus) infection   . Irregular periods 03/06/2014  . Vaginal Pap smear, abnormal     Patient Active Problem List   Diagnosis Date Noted  . Irregular periods 03/06/2014  . BV (bacterial vaginosis) 05/03/2012  . Asthma 04/16/2012  . Abnormal pap 04/16/2012  . Abdominal bloating 02/18/2012  . Rectal bleeding 02/18/2012    Past Surgical History:  Procedure Laterality Date  . COLONOSCOPY N/A 02/20/2012   QIO:[id number] sized internal hemorrhoids/otherwise normal  . LEEP    . None      OB History    Gravida Para Term Preterm AB Living   2 2           SAB TAB Ectopic Multiple Live Births                   Home Medications    Prior to Admission medications   Not on File    Family History Family History  Problem Relation Age of Onset  . Hypertension Paternal Grandmother   . Diabetes Maternal Grandmother   . Hypertension Maternal Grandmother     . Diabetes Maternal Grandfather   . Hypertension Mother   . Diabetes Maternal Uncle   . Diabetes Maternal Uncle   . Colon cancer Neg Hx     Social History Social History  Substance Use Topics  . Smoking status: Current Every Day Smoker    Packs/day: 0.50    Years: 10.00    Types: Cigarettes  . Smokeless tobacco: Never Used  . Alcohol use Yes     Allergies   Review of patient's allergies indicates no known allergies.   Review of Systems Review of Systems  Constitutional: Negative for chills and fever.  HENT: Negative for sore throat and trouble swallowing.   Respiratory: Negative for shortness of breath.   Cardiovascular: Negative for chest pain.  Gastrointestinal: Positive for abdominal pain, blood in stool, nausea, rectal pain and vomiting. Negative for abdominal distention and diarrhea.  Genitourinary: Negative for dysuria, flank pain, hematuria and vaginal discharge.  Musculoskeletal: Negative for neck pain and neck stiffness.  Skin: Negative for rash.  Neurological: Negative for dizziness, syncope and headaches.     Physical Exam Updated Vital Signs BP 113/61   Pulse 91   Temp 98.5 F (36.9 C) (Oral)   Resp 16   Ht  (  1.676 m)   Wt 65.8 kg   LMP 08/11/2015   SpO2 98%   BMI 23.40 kg/m   Physical Exam  Constitutional: She appears well-developed and well-nourished. No distress.  HENT:  Head: Normocephalic and atraumatic.  Eyes: Conjunctivae are normal.  Neck: Normal range of motion.  Cardiovascular: Normal rate, regular rhythm and normal heart sounds.  Exam reveals no gallop and no friction rub.   No murmur heard. Pulmonary/Chest: Effort normal and breath sounds normal. No respiratory distress. She has no wheezes. She has no rales.  Abdominal: Normal appearance and bowel sounds are normal. She exhibits no distension. There is tenderness in the right lower quadrant and periumbilical area. There is guarding and tenderness at McBurney's point. There is  no rigidity and no CVA tenderness.  Musculoskeletal: Normal range of motion.  Neurological: She is alert. Coordination normal.  Skin: Skin is warm and dry. She is not diaphoretic.  Psychiatric: She has a normal mood and affect. Her behavior is normal.  Nursing note and vitals reviewed.    ED Treatments / Results  Labs (all labs ordered are listed, but only abnormal results are displayed) Labs Reviewed  COMPREHENSIVE METABOLIC PANEL - Abnormal; Notable for the following:       Result Value   Sodium 132 (*)    Glucose, Bld 133 (*)    Calcium 8.5 (*)    All other components within normal limits  CBC - Abnormal; Notable for the following:    WBC 11.1 (*)    All other components within normal limits  LIPASE, BLOOD  HCG, QUANTITATIVE, PREGNANCY  URINALYSIS, ROUTINE W REFLEX MICROSCOPIC (NOT AT [facility])  POC URINE PREG, ED    EKG  EKG Interpretation None       Radiology No results found.  Procedures Procedures (including critical care time)  Medications Ordered in ED Medications  ondansetron (ZOFRAN) injection 4 mg (4 mg Intravenous Given 08/12/15 2231)  sodium chloride 0.9 % bolus 1,000 mL (1,000 mLs Intravenous New Bag/Given 08/12/15 2324)  morphine 4 MG/ML injection 4 mg (4 mg Intravenous Given 08/12/15 2326)     Initial Impression / Assessment and Plan / ED Course  I have reviewed the triage vital signs and the nursing notes.  Pertinent labs & imaging results that were available during my care of the patient were reviewed by me and considered in my medical decision making (see chart for details).  Clinical Course   Pt with abdominal pain, N, V. Labs show mild dehydration, gave pt fluids and pain medicine in the ED. Pt pain located in the RLQ and suprapubic region and complaints of rectal discomfort. History and presentation concerning for appendicitis. Mildly elevated WBC. No murphy sign less concerning for cholecystitis. Pt denies vaginal symptoms or discharge. Will  obtain CT abd/pelvis to evaluate for appendicitis. Pending Ct scan. If negative will d/c home with symptomatic treatment.   Pt care signed out to Dr. Manus Gunning at change of shift.   Final Clinical Impressions(s) / ED Diagnoses   Final diagnoses:  Adnexal pain  PID (acute pelvic inflammatory disease)  Abdominal pain, unspecified abdominal location    New Prescriptions New Prescriptions   No medications on file     Jerre Simon, [place] 08/13/15 1523    Glynn Octave, MD 08/13/15 [phone]

## 2015-08-13 ENCOUNTER — Emergency Department (HOSPITAL_COMMUNITY): Payer: Self-pay

## 2015-08-13 LAB — URINALYSIS, ROUTINE W REFLEX MICROSCOPIC
Bilirubin Urine: NEGATIVE
GLUCOSE, UA: NEGATIVE mg/dL
HGB URINE DIPSTICK: NEGATIVE
Ketones, ur: 40 mg/dL — AB
Nitrite: NEGATIVE
PROTEIN: NEGATIVE mg/dL
Specific Gravity, Urine: 1.015 (ref 1.005–1.030)
pH: 8 (ref 5.0–8.0)

## 2015-08-13 LAB — WET PREP, GENITAL
Sperm: NONE SEEN
TRICH WET PREP: NONE SEEN
YEAST WET PREP: NONE SEEN

## 2015-08-13 LAB — URINE MICROSCOPIC-ADD ON

## 2015-08-13 MED ORDER — METRONIDAZOLE 500 MG PO TABS: 500.0000 mg | ORAL_TABLET | Freq: Three times a day (TID) | ORAL | 0 refills | Status: DC

## 2015-08-13 MED ORDER — AZITHROMYCIN 250 MG PO TABS
1000.0000 mg | ORAL_TABLET | Freq: Once | ORAL | Status: AC
  Administered 2015-08-13: 1000 mg via ORAL
  Filled 2015-08-13: qty 4

## 2015-08-13 MED ORDER — CEFTRIAXONE SODIUM 250 MG IJ SOLR
250.0000 mg | Freq: Once | INTRAMUSCULAR | Status: AC
  Administered 2015-08-13: 250 mg via INTRAMUSCULAR
  Filled 2015-08-13: qty 250

## 2015-08-13 MED ORDER — DIATRIZOATE MEGLUMINE & SODIUM 66-10 % PO SOLN
ORAL | Status: AC
  Filled 2015-08-13: qty 30

## 2015-08-13 MED ORDER — LIDOCAINE HCL (PF) 1 % IJ SOLN
INTRAMUSCULAR | Status: AC
  Administered 2015-08-13: 0.9 mL
  Filled 2015-08-13: qty 5

## 2015-08-13 MED ORDER — IOPAMIDOL (ISOVUE-300) INJECTION 61%
100.0000 mL | Freq: Once | INTRAVENOUS | Status: AC | PRN
  Administered 2015-08-13: 100 mL via INTRAVENOUS

## 2015-08-13 NOTE — ED Notes (Signed)
Ultrasound has been called in for pt, pt updated,

## 2015-08-13 NOTE — ED Notes (Signed)
Pt returned from ct, md at bedside,

## 2015-08-13 NOTE — ED Notes (Signed)
Pt resting with eyes closed, resp even and non labored,  

## 2015-08-13 NOTE — Discharge Instructions (Signed)
Take the antibiotics as prescribed and followup with Dr. Ferguson. Return to the ED if you develop new or worsening symptoms. °

## 2015-08-13 NOTE — ED Notes (Signed)
Pt returns from ultrasound,

## 2015-08-13 NOTE — ED Notes (Signed)
Pt transported to ultrasound.

## 2015-08-13 NOTE — ED Notes (Signed)
Pt remains in ultrasound,  

## 2015-08-13 NOTE — ED Notes (Signed)
Patient transported to CT 

## 2015-08-13 NOTE — ED Provider Notes (Signed)
Medical screening examination/treatment/procedure(s) were conducted as a shared visit with non-physician practitioner(s) and myself.  I personally evaluated the patient during the encounter.   EKG Interpretation None       Lower abdominal pain since yesterday with nausea and vomiting  Patient's pelvic exam performed with significant cervical motion tenderness, discharge and right adnexal tenderness. CT scan shows free fluid in the pelvis with normal appendix.  Patient with significant lower abdominal tenderness on exam with voluntary guarding. Pelvic exam as above. With significant ongoing pain we'll obtain ultrasound to evaluate for TOA or ovarian torsion.  Ultrasound shows no ovarian torsion or other significant pathology. No abscess. Patient treated for PID with Rocephin and azithromycin. We'll also give Flagyl for bacterial vaginosis. Follow-up with gynecology. Return precautions discussed.   Glynn Octave, MD 08/13/15 [phone]

## 2015-08-14 LAB — GC/CHLAMYDIA PROBE AMP (~~LOC~~) NOT AT ARMC
CHLAMYDIA, DNA PROBE: NEGATIVE
NEISSERIA GONORRHEA: NEGATIVE

## 2015-08-14 LAB — URINE CULTURE

## 2015-08-19 ENCOUNTER — Telehealth (HOSPITAL_BASED_OUTPATIENT_CLINIC_OR_DEPARTMENT_OTHER): Payer: Self-pay | Admitting: Emergency Medicine

## 2015-09-24 ENCOUNTER — Other Ambulatory Visit: Payer: Self-pay | Admitting: Adult Health

## 2016-03-24 ENCOUNTER — Ambulatory Visit (HOSPITAL_COMMUNITY)
Admission: RE | Admit: 2016-03-24 | Discharge: 2016-03-24 | Disposition: A | Discharge status: Home / Self Care | Payer: Self-pay | Source: Ambulatory Visit | Attending: Nurse Practitioner | Admitting: Nurse Practitioner

## 2016-03-24 ENCOUNTER — Encounter: Payer: Self-pay | Admitting: Nurse Practitioner

## 2016-03-24 ENCOUNTER — Ambulatory Visit (INDEPENDENT_AMBULATORY_CARE_PROVIDER_SITE_OTHER): Payer: Self-pay | Admitting: Nurse Practitioner

## 2016-03-24 VITALS — BP 102/61 | HR 63 | Temp 98.0°F | Ht 66.0 in | Wt 129.2 lb

## 2016-03-24 DIAGNOSIS — R1030 Lower abdominal pain, unspecified: Secondary | ICD-10-CM | POA: Insufficient documentation

## 2016-03-24 DIAGNOSIS — K5900 Constipation, unspecified: Secondary | ICD-10-CM

## 2016-03-24 DIAGNOSIS — K625 Hemorrhage of anus and rectum: Secondary | ICD-10-CM

## 2016-03-24 DIAGNOSIS — R109 Unspecified abdominal pain: Secondary | ICD-10-CM | POA: Insufficient documentation

## 2016-03-24 MED ORDER — HYDROCORTISONE 2.5 % RE CREA: 1.0000 "application " | TOPICAL_CREAM | Freq: Two times a day (BID) | RECTAL | 1 refills | Status: DC

## 2016-03-24 MED ORDER — LINACLOTIDE 72 MCG PO CAPS: 72.0000 ug | ORAL_CAPSULE | Freq: Every day | ORAL | 0 refills | Status: DC

## 2016-03-24 NOTE — Progress Notes (Signed)
CC'ED TO PCP 

## 2016-03-24 NOTE — Progress Notes (Signed)
Primary Care Physician:  Avon Gully, MD Primary Gastroenterologist:  Dr. Darrick Penna  Chief Complaint  Patient presents with  . Abdominal Pain  . Weight Loss  . change in bowels    mucous  . Rectal Bleeding    HPI:   Kimberly [name] is a 36 y.o. female who presents for abdominal pain, change in bowel habits, and rectal bleeding. The patient has not been seen by our office since 02/16/2012 at which point she was seen for rectal bleeding and abdominal bloating. Previous visits for rectal bleeding and recommended colonoscopy were not completed because the patient "was scared." Describes intermittent abdominal pain, rectal discomfort, scant hematochezia with straining at that time. Usually 3 bowel movements a week consistent with [place] 1-3. Some associated nausea at that time. Recommended TSH, celiac serology, colonoscopy. She was started on Linzess 145 g daily and a high-fiber diet. TSH and celiac studies were normal.  Colonoscopy completed 02/20/2012 which found fair prep in the right colon, normal terminal ileum and colon mucosa. Rectal bleeding due to moderate sized internal hemorrhoids. Recommended follow-up in office. She was a no-show for 3 visits in a row. She did call on 06/18/2015 complaining of 10 pound weight loss and worms in the toilet after a bowel movement and wanting [place] same day appointment. An appointment was scheduled and she was a no-show for this.  The patient is a poor historian. Today she states she's having stool changes. She is unable to describe her previous bowel movement and says it was "[place] 1 but then says maybe more like 5, or maybe 3." Finally settled on likely [place] 3 with "I can feel it scraping through my intestines." States she is not having abdominal pain states "it feels like a dragging through my intestines" but then states later that "it is pain" in the lower abdomen which improves with bowel movement. Has one bowel movement a day but sensation of  incomplete emptying. Has had rectal irritation after her last period though maybe related to Kotex she used to use. Used Monistat but didn't help. States there was a long noodle in her stool several months back and thought it was a worm. She states she didn't come to the appointment because she was scared. Now her stools are mucusy, like "bristol 2-3" still formed but "not smooth, kinda lumpy." Has rectal bleeding "almost every time" as red blood mixed in the stool, none on the toilet tissue. Denies melena. Had nausea and vomiting end of last year when she had severe abdominal pain in the ER (for PID). States she has lost 41 lbs since September (subjectively) and states "I still like to eat" but asked if she had a change in appetite states "I did have a change in appetite not eating with all the stress." Has history of internal hemorrhoids. Denies chest pain, dyspnea, dizziness, lightheadedness, syncope, near syncope. Denies any other upper or lower GI symptoms.  Past Medical History:  Diagnosis Date  . Abnormal Pap smear   . Asthma   . BV (bacterial vaginosis) 05/03/2012  . Chlamydia   . Depression   . HPV (human papilloma virus) infection   . Irregular periods 03/06/2014  . Vaginal Pap smear, abnormal     Past Surgical History:  Procedure Laterality Date  . COLONOSCOPY N/A 02/20/2012   [id number] sized internal hemorrhoids/otherwise normal  . LEEP    . None      Current Outpatient Prescriptions  Medication Sig Dispense Refill  . hydrocortisone (ANUSOL-HC) 2.5 %  rectal cream Place 1 application rectally 2 (two) times daily. 30 g 1  . linaclotide (LINZESS) 72 MCG capsule Take 1 capsule (72 mcg total) by mouth daily before breakfast. 8 capsule 0   No current facility-administered medications for this visit.     Allergies as of 03/24/2016  . (No Known Allergies)    Family History  Problem Relation Age of Onset  . Hypertension Paternal Grandmother   . Diabetes Maternal Grandmother     . Hypertension Maternal Grandmother   . Diabetes Maternal Grandfather   . Hypertension Mother   . Diabetes Maternal Uncle   . Diabetes Maternal Uncle   . Colon cancer Neg Hx     Social History   Social History  . Marital status: Single    Spouse name: N/A  . Number of children: N/A  . Years of education: N/A   Occupational History  . Not on file.   Social History Main Topics  . Smoking status: Current Every Day Smoker    Packs/day: 0.50    Years: 10.00    Types: Cigarettes  . Smokeless tobacco: Never Used  . Alcohol use No  . Drug use: No  . Sexual activity: Yes   Other Topics Concern  . Not on file   Social History Narrative  . No narrative on file    Review of Systems: Complete ROS negative except as per HPI.    Physical Exam: BP 102/61   Pulse 63   Temp 98 F (36.7 C) (Oral)   Ht 5\' 6"  (1.676 m)   Wt 129 lb 3.2 oz (58.6 kg)   LMP 02/29/2016 (Approximate)   BMI 20.85 kg/m  General:   Alert and oriented. Pleasant and cooperative. Well-nourished and well-developed. Appears frustrated and flustered. Head:  Normocephalic and atraumatic. Eyes:  Without icterus, sclera clear and conjunctiva pink.  Ears:  Normal auditory acuity. Cardiovascular:  S1, S2 present without murmurs appreciated. Extremities without clubbing or edema. Respiratory:  Clear to auscultation bilaterally. No wheezes, rales, or rhonchi. No distress.  Gastrointestinal:  +BS, soft, non-tender and non-distended. No HSM noted. No guarding or rebound. No masses appreciated.  Rectal:  Deferred  Musculoskalatal:  Symmetrical without gross deformities. Neurologic:  Alert and oriented x4;  grossly normal neurologically. Psych:  Alert and cooperative. Anxious mood. Heme/Lymph/Immune: No excessive bruising noted.    03/24/2016 9:51 AM   Disclaimer: This note was dictated with voice recognition software. Similar sounding words can inadvertently be transcribed and may not be corrected upon  review.

## 2016-03-24 NOTE — Patient Instructions (Signed)
1. Take Linzess once a day on an empty stomach. Keep in mind you may have some diarrhea initially 2. Call us in 1-2 weeks and let us know if it is helping 3. I sent in a prescription for Anusol rectal cream to help with hemorrhoids and rectal irritation. Take it twice a day, up to 10 days at a time, as needed. 4. Have your XRay done when you're able to 5. Return for follow-up in 2 months.

## 2016-03-29 ENCOUNTER — Ambulatory Visit: Payer: Self-pay | Admitting: Adult Health

## 2016-03-30 NOTE — Progress Notes (Signed)
PT is aware and said the Linzess is helping.

## 2016-04-04 ENCOUNTER — Other Ambulatory Visit: Payer: Self-pay | Admitting: Adult Health

## 2016-04-13 ENCOUNTER — Encounter: Payer: Self-pay | Admitting: Adult Health

## 2016-04-13 ENCOUNTER — Ambulatory Visit (INDEPENDENT_AMBULATORY_CARE_PROVIDER_SITE_OTHER): Payer: Self-pay | Admitting: Adult Health

## 2016-04-13 ENCOUNTER — Other Ambulatory Visit (HOSPITAL_COMMUNITY)
Admission: RE | Admit: 2016-04-13 | Discharge: 2016-04-13 | Disposition: A | Discharge status: Home / Self Care | Payer: Self-pay | Source: Ambulatory Visit | Attending: Adult Health | Admitting: Adult Health

## 2016-04-13 VITALS — BP 110/60 | HR 78 | Ht 65.0 in | Wt 134.5 lb

## 2016-04-13 DIAGNOSIS — Z113 Encounter for screening for infections with a predominantly sexual mode of transmission: Secondary | ICD-10-CM

## 2016-04-13 DIAGNOSIS — R194 Change in bowel habit: Secondary | ICD-10-CM | POA: Insufficient documentation

## 2016-04-13 DIAGNOSIS — Z8719 Personal history of other diseases of the digestive system: Secondary | ICD-10-CM | POA: Insufficient documentation

## 2016-04-13 DIAGNOSIS — A549 Gonococcal infection, unspecified: Secondary | ICD-10-CM | POA: Insufficient documentation

## 2016-04-13 DIAGNOSIS — Z1212 Encounter for screening for malignant neoplasm of rectum: Secondary | ICD-10-CM | POA: Insufficient documentation

## 2016-04-13 DIAGNOSIS — Z01419 Encounter for gynecological examination (general) (routine) without abnormal findings: Secondary | ICD-10-CM | POA: Insufficient documentation

## 2016-04-13 DIAGNOSIS — N3945 Continuous leakage: Secondary | ICD-10-CM

## 2016-04-13 DIAGNOSIS — Z1211 Encounter for screening for malignant neoplasm of colon: Secondary | ICD-10-CM | POA: Insufficient documentation

## 2016-04-13 LAB — HEMOCCULT GUIAC POC 1CARD (OFFICE): FECAL OCCULT BLD: NEGATIVE

## 2016-04-13 MED ORDER — OXYBUTYNIN CHLORIDE ER 10 MG PO TB24: 10.0000 mg | ORAL_TABLET | Freq: Every day | ORAL | 3 refills | Status: DC

## 2016-04-13 NOTE — Progress Notes (Signed)
Patient ID: Kimberly [name], female   DOB: [birth year], 36 y.o.   MRN: [id number] History of Present Illness: Kimberly [name] is a 36 year old black female in for a well woman gyn exam and pap. She wants STD testing and says it has been a year since she had sex.She complains of leaking urine, almost all the time, and BMs mucous like and has white stuff on her BM(like yeast),and chaffing at rectal area, has seen Kimberly Dust NP at [facility], but has not started meds it. Has noticed vaginal discharge too.  PCP Dr Kimberly [name].   Current Medications, Allergies, Past Medical History, Past Surgical History, Family History and Social History were reviewed in [employer] record.     Review of Systems: Patient denies any headaches, hearing loss, fatigue, blurred vision, shortness of breath, chest pain, abdominal pain, problems with intercourse(not currently having sex). No joint pain or mood swings.See HPI for positives.    Physical Exam:BP 110/60 (BP Location: Left Arm, Patient Position: Sitting, Cuff Size: Normal)   Pulse 78   Ht  (1.651 m)   Wt 134 lb 8 oz (61 kg)   LMP 03/25/2016 (Approximate)   BMI 22.38 kg/m  General:  Well developed, well nourished, no acute distress Skin:  Warm and dry Neck:  Midline trachea, normal thyroid, good ROM, no lymphadenopathy Lungs; Clear to auscultation bilaterally Breast:  No dominant palpable mass, retraction, or nipple discharge Cardiovascular: Regular rate and rhythm Abdomen:  Soft, non tender, no hepatosplenomegaly Pelvic:  External genitalia is normal in appearance, no lesions.  The vagina is normal in appearance, no discharge noted. Urethra has no lesions or masses. The cervix is bulbous, and smooth, pap with HPV and GC/CHL performed.  Uterus is felt to be normal size, shape, and contour.  No adnexal masses or tenderness noted.Bladder is non tender, no masses felt. Rectal: Good sphincter tone, no polyps, or hemorrhoids felt.  Hemoccult  negative. Extremities/musculoskeletal:  No swelling or varicosities noted, no clubbing or cyanosis Psych:  No mood changes, alert and cooperative,seems happy PHQ 2 score 0. Encouraged to use meds as per Kimberly [name], will give trial of ditropan to see if helps.  Impression: 1. Encounter for gynecological examination with Papanicolaou smear of cervix   2. History of rectal bleeding   3. Bowel habit changes   4. Screening for colorectal cancer   5. Screening examination for STD (sexually transmitted disease)   6. Continuous leakage of urine       Plan: Check HIV and RPR Meds ordered this encounter  Medications  . oxybutynin (DITROPAN-XL) 10 MG 24 hr tablet    Sig: Take 1 tablet (10 mg total) by mouth at bedtime.    Dispense:  30 tablet    Refill:  3    Order Specific Question:   Supervising Provider    Answer:   Kimberly [name] [[id number]]  Physical in 1 year, pap in 3 if normal Mammogram at 41

## 2016-04-14 LAB — HIV ANTIBODY (ROUTINE TESTING W REFLEX): HIV Screen 4th Generation wRfx: NONREACTIVE

## 2016-04-14 LAB — RPR: RPR Ser Ql: NONREACTIVE

## 2016-04-18 ENCOUNTER — Encounter: Payer: Self-pay | Admitting: Adult Health

## 2016-04-18 ENCOUNTER — Telehealth: Payer: Self-pay | Admitting: Adult Health

## 2016-04-18 DIAGNOSIS — A549 Gonococcal infection, unspecified: Secondary | ICD-10-CM

## 2016-04-18 HISTORY — DX: Gonococcal infection, unspecified: A54.9

## 2016-04-18 LAB — CYTOLOGY - PAP
CHLAMYDIA, DNA PROBE: NEGATIVE
Diagnosis: NEGATIVE
HPV (WINDOPATH): NOT DETECTED
Neisseria Gonorrhea: POSITIVE — AB

## 2016-04-18 MED ORDER — AZITHROMYCIN 500 MG PO TABS: ORAL_TABLET | ORAL | 0 refills | Status: DC

## 2016-04-18 NOTE — Telephone Encounter (Signed)
Pt called stating that she would like to know the results of her blood work. Please contact pt °

## 2016-04-18 NOTE — Telephone Encounter (Signed)
Left message to call me back.If she calls, needs to know +GC on pap, needs to come in for rocephin 250 mg IM in office and that I sent RX to [facility] for azithromycin and no sex for 4 weeks and POT in 4 weeks, will send [facility]

## 2016-04-19 ENCOUNTER — Ambulatory Visit (INDEPENDENT_AMBULATORY_CARE_PROVIDER_SITE_OTHER): Payer: Self-pay

## 2016-04-19 ENCOUNTER — Telehealth: Payer: Self-pay | Admitting: *Deleted

## 2016-04-19 DIAGNOSIS — A549 Gonococcal infection, unspecified: Secondary | ICD-10-CM

## 2016-04-19 MED ORDER — CEFTRIAXONE SODIUM 250 MG IJ SOLR
250.0000 mg | Freq: Once | INTRAMUSCULAR | Status: AC
  Administered 2016-04-19: 250 mg via INTRAMUSCULAR

## 2016-04-19 NOTE — Progress Notes (Signed)
PT here for Rocephin 250 mg IM given RT ventrogluteal. Tolerated well. postive Gonorrhea. Return 4 weeks for proof of cure.pad CMA

## 2016-04-19 NOTE — Telephone Encounter (Signed)
Pt aware +CG on pap, to come in today at 11 for rocephin 250 mg IM in office and that azithromycin called to drug store, no sex, POT in 4 weeks and [facility] sent

## 2016-04-27 NOTE — Progress Notes (Signed)
REVIEWED-NO ADDITIONAL RECOMMENDATIONS. 

## 2016-05-17 ENCOUNTER — Ambulatory Visit: Payer: Self-pay | Admitting: Adult Health

## 2016-05-18 ENCOUNTER — Encounter: Payer: Self-pay | Admitting: Gastroenterology

## 2016-05-18 ENCOUNTER — Ambulatory Visit: Payer: Self-pay | Admitting: Gastroenterology

## 2016-05-18 ENCOUNTER — Telehealth: Payer: Self-pay | Admitting: Gastroenterology

## 2016-05-18 NOTE — Progress Notes (Deleted)
   Subjective:    Patient ID: Kimberly [name], female    DOB: [birth year], 36 y.o.   MRN: [id number]  HPI    Review of Systems     Objective:   Physical Exam        Assessment & Plan:

## 2016-05-18 NOTE — Telephone Encounter (Signed)
PATIENT WAS A NO SHOW AND LETTER SENT  °

## 2016-05-19 NOTE — Telephone Encounter (Signed)
REVIEWED-NO ADDITIONAL RECOMMENDATIONS. 

## 2016-05-31 ENCOUNTER — Ambulatory Visit: Payer: Self-pay | Admitting: Adult Health

## 2016-06-15 ENCOUNTER — Ambulatory Visit: Payer: Self-pay | Admitting: Adult Health

## 2016-07-22 ENCOUNTER — Ambulatory Visit (INDEPENDENT_AMBULATORY_CARE_PROVIDER_SITE_OTHER): Payer: Self-pay | Admitting: Obstetrics & Gynecology

## 2016-07-22 ENCOUNTER — Encounter: Payer: Self-pay | Admitting: Obstetrics & Gynecology

## 2016-07-22 VITALS — BP 102/60 | HR 73 | Wt 154.0 lb

## 2016-07-22 DIAGNOSIS — B9689 Other specified bacterial agents as the cause of diseases classified elsewhere: Secondary | ICD-10-CM

## 2016-07-22 DIAGNOSIS — N760 Acute vaginitis: Secondary | ICD-10-CM

## 2016-07-22 DIAGNOSIS — Z8619 Personal history of other infectious and parasitic diseases: Secondary | ICD-10-CM

## 2016-07-22 MED ORDER — METRONIDAZOLE 500 MG PO TABS: 500.0000 mg | ORAL_TABLET | Freq: Two times a day (BID) | ORAL | 0 refills | Status: DC

## 2016-07-22 NOTE — Progress Notes (Signed)
Chief Complaint  Patient presents with  . Vaginal Discharge    POC    Blood pressure 102/60, pulse 73, weight 154 lb (69.9 kg), last menstrual period 07/12/2016.  36 y.o. [id number] Patient's last menstrual period was 07/12/2016. The current method of family planning is none.  Subjective Vaginal discharge for 1weeks Itching no Irritation yes Odor no Similar to previous no  Previous treatment na  Objective Vulva:  normal appearing vulva with no masses, tenderness or lesions Vagina:  normal mucosa, thin grey discharge Cervix:  no cervical motion tenderness and no lesions Uterus:  normal size, contour, position, consistency, mobility, non-tender Adnexa: ovaries:,       Pertinent ROS   Labs or studies Wet Prep:   A sample of vaginal discharge was obtained from the posterior fornix using a cotton swab. 2 drops of saline were placed on a slide and the cotton swab was immersed in the saline. Microscopic evaluation was performed and results were as follows:  Negative  for yeast  Positive for clue cells , consistent with Bacterial vaginosis Negative for trichomonas  Normal WBC population   Whiff test: Positive     Impression Diagnoses this Encounter::   ICD-10-CM   1. BV (bacterial vaginosis) N76.0    B96.89   2. History of gonorrhea Z86.19 GC/Chlamydia Probe Amp    Established relevant diagnosis(es):   Plan/Recommendations: Meds ordered this encounter  Medications  . metroNIDAZOLE (FLAGYL) 500 MG tablet    Sig: Take 1 tablet (500 mg total) by mouth 2 (two) times daily.    Dispense:  14 tablet    Refill:  0    Labs or Scans Ordered: Orders Placed This Encounter  Procedures  . GC/Chlamydia Probe Amp    Management:: Test of cure culture done Treat with metrogel  Follow up Return if symptoms worsen or fail to improve.     All questions were answered.  Past Medical History:  Diagnosis Date  . Abnormal Pap smear   . Asthma   . BV  (bacterial vaginosis) 05/03/2012  . Chlamydia   . Depression   . Gonorrhea 04/18/2016  . HPV (human papilloma virus) infection   . Irregular periods 03/06/2014  . Vaginal Pap smear, abnormal     Past Surgical History:  Procedure Laterality Date  . COLONOSCOPY N/A 02/20/2012   YNW:[id number]:Moderate sized internal hemorrhoids/otherwise normal  . LEEP    . None      OB History    Gravida Para Term Preterm AB Living   2 2 2     2    SAB TAB Ectopic Multiple Live Births           2      No Known Allergies  Social History   Social History  . Marital status: Single    Spouse name: N/A  . Number of children: N/A  . Years of education: N/A   Social History Main Topics  . Smoking status: Current Some Day Smoker    Packs/day: 0.00    Years: 10.00    Types: Cigarettes  . Smokeless tobacco: Never Used     Comment: smokes 4-6 when she smokes  . Alcohol use No  . Drug use: Yes    Types: Marijuana     Comment: rarely  . Sexual activity: Yes    Birth control/ protection: None   Other Topics Concern  . None   Social History Narrative  . None    Family History  Problem Relation Age of Onset  . Hypertension Paternal Grandmother   . Diabetes Maternal Grandmother   . Hypertension Maternal Grandmother   . Breast cancer Maternal Grandmother   . Cancer Paternal Grandfather   . Diabetes Maternal Grandfather   . Cancer Maternal Grandfather   . Hypertension Mother   . Diabetes Maternal Uncle   . Diabetes Maternal Uncle   . Colon cancer Neg Hx

## 2016-07-27 LAB — GC/CHLAMYDIA PROBE AMP
CHLAMYDIA, DNA PROBE: NEGATIVE
Neisseria gonorrhoeae by PCR: NEGATIVE

## 2016-09-13 ENCOUNTER — Encounter: Payer: Self-pay | Admitting: Obstetrics & Gynecology

## 2016-09-15 ENCOUNTER — Telehealth: Payer: Self-pay | Admitting: *Deleted

## 2016-09-15 NOTE — Telephone Encounter (Signed)
LMOVM for pt to call and make an appt to discuss medication to help with conception.

## 2016-10-17 ENCOUNTER — Ambulatory Visit (INDEPENDENT_AMBULATORY_CARE_PROVIDER_SITE_OTHER): Payer: BLUE CROSS/BLUE SHIELD | Admitting: Adult Health

## 2016-10-17 ENCOUNTER — Encounter: Payer: Self-pay | Admitting: Adult Health

## 2016-10-17 VITALS — BP 120/60 | HR 78 | Ht 65.0 in | Wt 168.0 lb

## 2016-10-17 DIAGNOSIS — Z319 Encounter for procreative management, unspecified: Secondary | ICD-10-CM

## 2016-10-17 MED ORDER — PRENATAL PLUS 27-1 MG PO TABS: 1.0000 | ORAL_TABLET | Freq: Every day | ORAL | 12 refills | Status: DC

## 2016-10-17 MED ORDER — OB COMPLETE PETITE 35-5-1-200 MG PO CAPS: ORAL_CAPSULE | ORAL | 12 refills | Status: DC

## 2016-10-17 NOTE — Patient Instructions (Signed)
Call with next period, check progesterone level day 21 Have sex every other day, day 7-24 of cycle Take vitamin daily  Preparing for Pregnancy If you are considering becoming pregnant, make an appointment to see your regular health care provider to learn how to prepare for a safe and healthy pregnancy (preconception care). During a preconception care visit, your health care provider will:  Do a complete physical exam, including a Pap test.  Take a complete medical history.  Give you information, answer your questions, and help you resolve problems.  Preconception checklist Medical history  Tell your health care provider about any current or past medical conditions. Your pregnancy or your ability to become pregnant may be affected by chronic conditions, such as diabetes, chronic hypertension, and thyroid problems.  Include your family's medical history as well as your partner's medical history.  Tell your health care provider about any history of STIs (sexually transmitted infections).These can affect your pregnancy. In some cases, they can be passed to your baby. Discuss any concerns that you have about STIs.  If indicated, discuss the benefits of genetic testing. This testing will show whether there are any genetic conditions that may be passed from you or your partner to your baby.  Tell your health care provider about: ? Any problems you have had with conception or pregnancy. ? Any medicines you take. These include vitamins, herbal supplements, and over-the-counter medicines. ? Your history of immunizations. Discuss any vaccinations that you may need.  Diet  Ask your health care provider what to include in a healthy diet that has a balance of nutrients. This is especially important when you are pregnant or preparing to become pregnant.  Ask your health care provider to help you reach a healthy weight before pregnancy. ? If you are overweight, you may be at higher risk for certain  complications, such as high blood pressure, diabetes, and preterm birth. ? If you are underweight, you are more likely to have a baby who has a low birth weight.  Lifestyle, work, and home  Let your health care provider know: ? About any lifestyle habits that you have, such as alcohol use, drug use, or smoking. ? About recreational activities that may put you at risk during pregnancy, such as downhill skiing and certain exercise programs. ? Tell your health care provider about any international travel, especially any travel to places with an active [place] virus outbreak. ? About harmful substances that you may be exposed to at work or at home. These include chemicals, pesticides, radiation, or even litter boxes. ? If you do not feel safe at home.  Mental health  Tell your health care provider about: ? Any history of mental health conditions, including feelings of depression, sadness, or anxiety. ? Any medicines that you take for a mental health condition. These include herbs and supplements.  Home instructions to prepare for pregnancy Lifestyle  Eat a balanced diet. This includes fresh fruits and vegetables, whole grains, lean meats, low-fat dairy products, healthy fats, and foods that are high in fiber. Ask to meet with a nutritionist or registered dietitian for assistance with meal planning and goals.  Get regular exercise. Try to be active for at least 30 minutes a day on most days of the week. Ask your health care provider which activities are safe during pregnancy.  Do not use any products that contain nicotine or tobacco, such as cigarettes and e-cigarettes. If you need help quitting, ask your health care provider.  Do not  drink alcohol.  Do not take illegal drugs.  Maintain a healthy weight. Ask your health care provider what weight range is right for you.  General instructions  Keep an accurate record of your menstrual periods. This makes it easier for your health care  provider to determine your baby's due date.  Begin taking prenatal vitamins and folic acid supplements daily as directed by your health care provider.  Manage any chronic conditions, such as high blood pressure and diabetes, as told by your health care provider. This is important.  How do I know that I am pregnant? You may be pregnant if you have been sexually active and you miss your period. Symptoms of early pregnancy include:  Mild cramping.  Very light vaginal bleeding (spotting).  Feeling unusually tired.  Nausea and vomiting (morning sickness).  If you have any of these symptoms and you suspect that you might be pregnant, you can take a home pregnancy test. These tests check for a hormone in your urine (human chorionic gonadotropin, or hCG). A woman's body begins to make this hormone during early pregnancy. These tests are very accurate. Wait until at least the first day after you miss your period to take one. If the test shows that you are pregnant (you get a positive result), call your health care provider to make an appointment for prenatal care. What should I do if I become pregnant?  Make an appointment with your health care provider as soon as you suspect you are pregnant.  Do not use any products that contain nicotine, such as cigarettes, chewing tobacco, and e-cigarettes. If you need help quitting, ask your health care provider.  Do not drink alcoholic beverages. Alcohol is related to a number of birth defects.  Avoid toxic odors and chemicals.  You may continue to have sexual intercourse if it does not cause pain or other problems, such as vaginal bleeding. This information is not intended to replace advice given to you by your health care provider. Make sure you discuss any questions you have with your health care provider. Document Released: 12/10/2007 Document Revised: 08/25/2015 Document Reviewed: 07/19/2015 Elsevier Interactive Patient Education  2017 [employer].

## 2016-10-17 NOTE — Progress Notes (Signed)
Subjective:     Patient ID: Kimberly [name], female   DOB: 03-19-1980, 36 y.o.   MRN: [id number]  HPI Kimberly [name] is a 36 year old black female in to discuss getting pregnant.She says periods only 2-3 days now, used to be 5-7 and she is not using birth control and has not gotten pregnant.   Review of Systems Periods shorter Reviewed past medical,surgical, social and family history. Reviewed medications and allergies.     Objective:   Physical Exam BP 120/60 (BP Location: Left Arm, Patient Position: Sitting, Cuff Size: Normal)   Pulse 78   Ht  (1.651 m)   Wt 168 lb (76.2 kg)   LMP 09/23/2016 (Exact Date)   BMI 27.96 kg/m    Talk only,discussed ovulation and timing of sex, and with age increased risk for Down's.Told her she needs to talk with partner, about any concerns.   Assessment:     Desires pregnancy    Plan:     Call with next period, will check progesterone leve day 21 Meds ordered this encounter  Medications  . DISCONTD: prenatal vitamin w/FE, FA (PRENATAL 1 + 1) 27-1 MG TABS tablet    Sig: Take 1 tablet by mouth daily at 12 noon.    Dispense:  30 each    Refill:  12    Order Specific Question:   Supervising Provider    Answer:   Despina Hidden, LUTHER H [[id number]]  . Prenat-FeCbn-FeAspGl-FA-Omega (OB COMPLETE PETITE) 35-5-1-200 MG CAPS    Sig: Take 1 daily    Dispense:  30 capsule    Refill:  12    Order Specific Question:   Supervising Provider    Answer:   Lazaro Arms [[id number]]  Have sex every other day, day 7-24 of cycle Review handout on preparing for pregnancy

## 2016-10-26 ENCOUNTER — Telehealth: Payer: Self-pay | Admitting: *Deleted

## 2016-10-26 DIAGNOSIS — Z319 Encounter for procreative management, unspecified: Secondary | ICD-10-CM

## 2016-10-26 NOTE — Telephone Encounter (Signed)
LMOVM to return call. Started cycle on 10/14. Looks like in your note she needs progesterone level on day 21, around 11/5. Please advise.

## 2016-10-26 NOTE — Telephone Encounter (Signed)
Get progesterone leve 11/3

## 2016-11-13 ENCOUNTER — Emergency Department (HOSPITAL_COMMUNITY)
Admission: EM | Admit: 2016-11-13 | Discharge: 2016-11-13 | Disposition: A | Discharge status: Home / Self Care | Payer: BLUE CROSS/BLUE SHIELD | Attending: Emergency Medicine | Admitting: Emergency Medicine

## 2016-11-13 ENCOUNTER — Encounter (HOSPITAL_COMMUNITY): Payer: Self-pay | Admitting: *Deleted

## 2016-11-13 DIAGNOSIS — F17210 Nicotine dependence, cigarettes, uncomplicated: Secondary | ICD-10-CM | POA: Insufficient documentation

## 2016-11-13 DIAGNOSIS — J45909 Unspecified asthma, uncomplicated: Secondary | ICD-10-CM | POA: Diagnosis not present

## 2016-11-13 DIAGNOSIS — N390 Urinary tract infection, site not specified: Secondary | ICD-10-CM | POA: Insufficient documentation

## 2016-11-13 DIAGNOSIS — R300 Dysuria: Secondary | ICD-10-CM | POA: Diagnosis present

## 2016-11-13 LAB — URINALYSIS, ROUTINE W REFLEX MICROSCOPIC
Bilirubin Urine: NEGATIVE
Glucose, UA: NEGATIVE mg/dL
KETONES UR: NEGATIVE mg/dL
Nitrite: NEGATIVE
PROTEIN: 100 mg/dL — AB
Specific Gravity, Urine: 1.029 (ref 1.005–1.030)
pH: 6 (ref 5.0–8.0)

## 2016-11-13 LAB — PREGNANCY, URINE: Preg Test, Ur: NEGATIVE

## 2016-11-13 MED ORDER — PHENAZOPYRIDINE HCL 100 MG PO TABS
100.0000 mg | ORAL_TABLET | Freq: Once | ORAL | Status: AC
  Administered 2016-11-13: 100 mg via ORAL
  Filled 2016-11-13: qty 1

## 2016-11-13 MED ORDER — ONDANSETRON HCL 4 MG PO TABS
4.0000 mg | ORAL_TABLET | Freq: Once | ORAL | Status: AC
  Administered 2016-11-13: 4 mg via ORAL
  Filled 2016-11-13: qty 1

## 2016-11-13 MED ORDER — CEPHALEXIN 500 MG PO CAPS
500.0000 mg | ORAL_CAPSULE | Freq: Once | ORAL | Status: AC
  Administered 2016-11-13: 500 mg via ORAL
  Filled 2016-11-13: qty 1

## 2016-11-13 MED ORDER — PHENAZOPYRIDINE HCL 100 MG PO TABS: 100.0000 mg | ORAL_TABLET | Freq: Three times a day (TID) | ORAL | 0 refills | Status: DC | PRN

## 2016-11-13 MED ORDER — CEFDINIR 300 MG PO CAPS: 300.0000 mg | ORAL_CAPSULE | Freq: Two times a day (BID) | ORAL | 0 refills | Status: DC

## 2016-11-13 NOTE — ED Notes (Addendum)
Pt c/o burning with urination that started Friday and has increased throughout the weekend, started noticing blood in urine today, has pressure to urinate, feels at times that she needs to urinate again right after she has finished. Admits to mucous discharge. Admits to having pain only with urination.

## 2016-11-13 NOTE — ED Triage Notes (Signed)
Pt with burning on urination, blood noted in urine since Friday night. Pt states she is trying to get pregnant.

## 2016-11-13 NOTE — ED Provider Notes (Signed)
[facility]ENCY [facility]PART[facility] Provider Note   CSN: [id number] Arrival date & time: 11/13/16  1958     History   Chief Complaint Chief Complaint  Patient presents with  . Dysuria    HPI Kimberly [name] is a 36 y.o. female.  Patient is a 36 year old female who presents to the emergency department with a complaint of dysuria.  The patient states that over the past 3 days she has been having burning with urination and at times is seen some blood in her urine.  She has not had fever or chills.  No unusual back pain.  No nausea vomiting.  The patient states that she is trying to get pregnant and she is not sure if increase in frequency of sex may have something to do with her dysuria.       Past Medical History:  Diagnosis Date  . Abnormal Pap smear   . Asthma   . BV (bacterial vaginosis) 05/03/2012  . Chlamydia   . Depression   . Gonorrhea 04/18/2016  . HPV (human papilloma virus) infection   . Irregular periods 03/06/2014  . Vaginal Pap smear, abnormal     Patient Active Problem List   Diagnosis Date Noted  . Gonorrhea 04/18/2016  . Screening for colorectal cancer 04/13/2016  . Bowel habit changes 04/13/2016  . History of rectal bleeding 04/13/2016  . Encounter for gynecological examination with Papanicolaou smear of cervix 04/13/2016  . Abdominal pain 03/24/2016  . Constipation 03/24/2016  . Irregular periods 03/06/2014  . BV (bacterial vaginosis) 05/03/2012  . Asthma 04/16/2012  . Abnormal pap 04/16/2012  . Abdominal bloating 02/18/2012  . Rectal bleeding 02/18/2012    Past Surgical History:  Procedure Laterality Date  . LEEP    . None      OB History    Gravida Para Term Preterm AB Living   2 2 2     2    SAB TAB Ectopic Multiple Live Births           2       Home Medications    Prior to Admission medications   Medication Sig Start Date End Date Taking? Authorizing Provider  Prenat-FeCbn-FeAspGl-FA-Omega (OB COMPLETE PETITE) 35-5-1-200 MG  CAPS Take 1 daily 10/17/16   Adline PotterGriffin, Jennifer A, NP    Family History Family History  Problem Relation Age of Onset  . Hypertension Paternal Grandmother   . Diabetes Maternal Grandmother   . Hypertension Maternal Grandmother   . Breast cancer Maternal Grandmother   . Cancer Paternal Grandfather   . Diabetes Maternal Grandfather   . Cancer Maternal Grandfather   . Hypertension Mother   . Diabetes Maternal Uncle   . Diabetes Maternal Uncle   . Colon cancer Neg Hx     Social History Social History   Tobacco Use  . Smoking status: Current Some Day Smoker    Packs/day: 0.00    Years: 10.00    Pack years: 0.00    Types: Cigarettes  . Smokeless tobacco: Never Used  . Tobacco comment: smokes 4-6 when she smokes  Substance Use Topics  . Alcohol use: No  . Drug use: Yes    Types: Marijuana    Comment: rarely     Allergies   Patient has no known allergies.   Review of Systems Review of Systems  Constitutional: Negative for activity change.       All ROS Neg except as noted in HPI  HENT: Negative for nosebleeds.  Eyes: Negative for photophobia and discharge.  Respiratory: Negative for cough, shortness of breath and wheezing.   Cardiovascular: Negative for chest pain and palpitations.  Gastrointestinal: Negative for abdominal pain and blood in stool.  Genitourinary: Positive for dysuria. Negative for frequency and hematuria.  Musculoskeletal: Negative for arthralgias, back pain and neck pain.  Skin: Negative.   Neurological: Negative for dizziness, seizures and speech difficulty.  Psychiatric/Behavioral: Negative for confusion and hallucinations.     Physical Exam Updated Vital Signs BP 119/74 (BP Location: Left Arm)   Pulse 88   Temp 98.4 F (36.9 C) (Oral)   Resp 17   Ht 5\' 6"  (1.676 m)   Wt 78.2 kg (172 lb 4.8 oz)   LMP 10/23/2016   SpO2 100%   BMI 27.81 kg/m   Physical Exam  Constitutional: She is oriented to person, place, and time. She appears  well-developed and well-nourished.  Non-toxic appearance.  HENT:  Head: Normocephalic.  Right Ear: Tympanic membrane and external ear normal.  Left Ear: Tympanic membrane and external ear normal.  Eyes: EOM and lids are normal. Pupils are equal, round, and reactive to light.  Neck: Normal range of motion. Neck supple. Carotid bruit is not present.  Cardiovascular: Normal rate, regular rhythm, normal heart sounds, intact distal pulses and normal pulses.  Pulmonary/Chest: Breath sounds normal. No respiratory distress.  Abdominal: Soft. Bowel sounds are normal. There is no tenderness. There is no guarding.  Musculoskeletal: Normal range of motion.  Lymphadenopathy:       Head (right side): No submandibular adenopathy present.       Head (left side): No submandibular adenopathy present.    She has no cervical adenopathy.  Neurological: She is alert and oriented to person, place, and time. She has normal strength. No cranial nerve deficit or sensory deficit.  Skin: Skin is warm and dry.  Psychiatric: She has a normal mood and affect. Her speech is normal.  Nursing note and vitals reviewed.    ED Treatments / Results  Labs (all labs ordered are listed, but only abnormal results are displayed) Labs Reviewed  URINALYSIS, ROUTINE W REFLEX MICROSCOPIC  PREGNANCY, URINE    EKG  EKG Interpretation None       Radiology No results found.  Procedures Procedures (including critical care time)  Medications Ordered in ED Medications - No data to display   Initial Impression / Assessment and Plan / ED Course  I have reviewed the triage vital signs and the nursing notes.  Pertinent labs & imaging results that were available during my care of the patient were reviewed by me and considered in my medical decision making (see chart for details).       Final Clinical Impressions(s) / ED Diagnoses MDM Vital signs are within normal limits.  Urine analysis suggests urinary tract  infection.  Culture has been sent to the lab.  There is no evidence of pyelonephritis or emergent changes at this time.  Urine pregnancy test is negative.  Patient will be treated with Omnicef and Pyridium.  I have asked the patient to increase fluids.  The patient is to follow-up with her primary physician Dr. Felecia Shelling if not improving.  Patient is in agreement with this plan.   Final diagnoses:  None    ED Discharge Orders    None       Ivery Quale, PA-C 11/13/16 2145    Margarita Grizzle, MD 11/15/16 1037

## 2016-11-13 NOTE — Discharge Instructions (Signed)
Your urine test suggest urinary tract infection.  Your pregnancy test is negative.  A culture of your urine is been sent to the lab.  Please increase water, Gatorade, juices, etc.  Please use Omnicef 2 times daily with food.  Please use Pyridium 3 times daily with food.  This medication may make the urine look like orange Kool-Aid, but it should help with the burning and discomfort of urination.

## 2016-11-16 LAB — URINE CULTURE: Special Requests: NORMAL

## 2016-11-17 ENCOUNTER — Telehealth: Payer: Self-pay | Admitting: *Deleted

## 2016-11-17 NOTE — Telephone Encounter (Signed)
Post ED Visit - Positive Culture Follow-up  Culture report reviewed by antimicrobial stewardship pharmacist:  []  Enzo BiNathan Batchelder, Pharm.D. []  Celedonio MiyamotoJeremy Frens, [address]Ph[address] BCPS AQ-ID []  Garvin FilaMike Maccia, Pharm.D., BCPS []  Georgina PillionElizabeth Martin, [address]Ph[address] BCPS []  FillmoreMinh Pham, [address]Ph[address]D., BCPS, AAHIVP []  Estella HuskMichelle Turner, Pharm.D., BCPS, AAHIVP []  Lysle Pearlachel Rumbarger, PharmD, BCPS []  Casilda Carlsaylor Stone, PharmD, BCPS []  Pollyann SamplesAndy Johnston, PharmD, BCPS Ann HeldLindsey Fontanski, PharmD  Positive urine culture Treated with Cefdinir, organism sensitive to the same and no further patient follow-up is required at this time.  Virl AxeRobertson, Ramona Ruark [facility] 11/17/2016, 11:45 AM

## 2017-03-24 ENCOUNTER — Ambulatory Visit: Payer: BLUE CROSS/BLUE SHIELD | Admitting: Adult Health

## 2017-05-04 ENCOUNTER — Ambulatory Visit (INDEPENDENT_AMBULATORY_CARE_PROVIDER_SITE_OTHER): Payer: BLUE CROSS/BLUE SHIELD | Admitting: Adult Health

## 2017-05-04 ENCOUNTER — Encounter: Payer: Self-pay | Admitting: Adult Health

## 2017-05-04 VITALS — BP 124/70 | HR 76 | Ht 65.0 in | Wt 172.0 lb

## 2017-05-04 DIAGNOSIS — N898 Other specified noninflammatory disorders of vagina: Secondary | ICD-10-CM | POA: Diagnosis not present

## 2017-05-04 DIAGNOSIS — Z319 Encounter for procreative management, unspecified: Secondary | ICD-10-CM

## 2017-05-04 DIAGNOSIS — Z3169 Encounter for other general counseling and advice on procreation: Secondary | ICD-10-CM | POA: Diagnosis not present

## 2017-05-04 NOTE — Progress Notes (Signed)
Subjective:     Patient ID: Kimberly [name], female   DOB: 1980-03-27, 37 y.o.   MRN: [id number]  HPI Kimberly [name] is a 37 year old black female in complaining of vaginal discharge, was brown after last period and she douched. She wants to get pregnant.   Review of Systems Vaginal discharge, douched after last period Reviewed past medical,surgical, social and family history. Reviewed medications and allergies.     Objective:   Physical Exam BP 124/70 (BP Location: Left Arm, Patient Position: Sitting, Cuff Size: Normal)   Pulse 76   Ht 5\' 5"  (1.651 m)   Wt 172 lb (78 kg)   LMP 04/21/2017   BMI 28.62 kg/m   Skin warm and dry.Pelvic: external genitalia is normal in appearance no lesions, vagina: scant discharge without odor,urethra has no lesions or masses noted, cervix:smooth and bulbous, 1st degree uterine descensus,No CMTuterus: normal size, shape and contour, non tender, no masses felt, adnexa: no masses or tenderness noted. Bladder is non tender and no masses felt. Nuswab obtained.     Assessment:     1. Vaginal discharge   2. Patient desires pregnancy       Plan:    Take OTC PNV Nuswab sent Have sex tonight Pee before sex and lay 30 minutes after, try to have sex every other day 7-24, but boyfriend works out of town some F/U prn

## 2017-05-11 ENCOUNTER — Telehealth: Payer: Self-pay | Admitting: Adult Health

## 2017-05-11 LAB — NUSWAB VAGINITIS PLUS (VG+)
BVAB 2: HIGH Score — AB
CANDIDA ALBICANS, NAA: NEGATIVE
CANDIDA GLABRATA, NAA: NEGATIVE
Chlamydia trachomatis, NAA: NEGATIVE
MEGASPHAERA 1: HIGH {score} — AB
NEISSERIA GONORRHOEAE, NAA: NEGATIVE
Trich vag by NAA: NEGATIVE

## 2017-05-11 MED ORDER — METRONIDAZOLE 500 MG PO TABS: 500.0000 mg | ORAL_TABLET | Freq: Two times a day (BID) | ORAL | 0 refills | Status: DC

## 2017-05-11 NOTE — Telephone Encounter (Signed)
Left message that nuswab +BV, rx'd flagyl, no alcohol or sex during treatment and all other tests negative, check my chart

## 2017-05-15 ENCOUNTER — Encounter: Payer: Self-pay | Admitting: Adult Health

## 2017-05-31 ENCOUNTER — Other Ambulatory Visit: Payer: BLUE CROSS/BLUE SHIELD | Admitting: Adult Health

## 2017-06-08 ENCOUNTER — Other Ambulatory Visit: Payer: BLUE CROSS/BLUE SHIELD | Admitting: Advanced Practice Midwife

## 2017-06-29 ENCOUNTER — Other Ambulatory Visit: Payer: BLUE CROSS/BLUE SHIELD | Admitting: Advanced Practice Midwife

## 2017-06-29 ENCOUNTER — Other Ambulatory Visit: Payer: BLUE CROSS/BLUE SHIELD | Admitting: Obstetrics and Gynecology

## 2017-08-14 ENCOUNTER — Ambulatory Visit (INDEPENDENT_AMBULATORY_CARE_PROVIDER_SITE_OTHER): Payer: BLUE CROSS/BLUE SHIELD | Admitting: Adult Health

## 2017-08-14 ENCOUNTER — Encounter: Payer: Self-pay | Admitting: Adult Health

## 2017-08-14 VITALS — BP 118/74 | HR 69 | Ht 66.0 in | Wt 174.0 lb

## 2017-08-14 DIAGNOSIS — Z3202 Encounter for pregnancy test, result negative: Secondary | ICD-10-CM | POA: Diagnosis not present

## 2017-08-14 DIAGNOSIS — Z113 Encounter for screening for infections with a predominantly sexual mode of transmission: Secondary | ICD-10-CM | POA: Diagnosis not present

## 2017-08-14 DIAGNOSIS — Z01419 Encounter for gynecological examination (general) (routine) without abnormal findings: Secondary | ICD-10-CM | POA: Diagnosis not present

## 2017-08-14 LAB — POCT URINE PREGNANCY: Preg Test, Ur: NEGATIVE

## 2017-08-14 NOTE — Progress Notes (Signed)
Patient ID: Kimberly [name], female   DOB: Sep 09, 1980, 37 y.o.   MRN: [id number] History of Present Illness: Kimberly OraDavenia is a 37 year old black female in for well woman gyn exam,she had normal pap with negative HPV 04/13/16. And had +GC then, was treated and has been negative since.  PCP is Dr Felecia ShellingFanta.  Current Medications, Allergies, Past Medical History, Past Surgical History, Family History and Social History were [facility].     Review of Systems: Patient denies any headaches, hearing loss, fatigue, blurred vision, shortness of breath, chest pain, abdominal pain, problems with bowel movements, urination, or intercourse. No joint pain or mood swings.    Physical Exam:BP 118/74 (BP Location: Left Arm, Patient Position: Sitting, Cuff Size: Normal)   Pulse 69   Ht 5\' 6"  (1.676 m)   Wt 174 lb (78.9 kg)   LMP 07/17/2017   BMI 28.08 kg/m UPT is negative.  General:  Well developed, well nourished, no acute distress Skin:  Warm and dry Neck:  Midline trachea, normal thyroid, good ROM, no lymphadenopathy Lungs; Clear to auscultation bilaterally Breast:  No dominant palpable mass, retraction, or nipple discharge Cardiovascular: Regular rate and rhythm Abdomen:  Soft, non tender, no hepatosplenomegaly Pelvic:  External genitalia is normal in appearance, no lesions.  The vagina is normal in appearance. Urethra has no lesions or masses. The cervix is bulbous.  Uterus is felt to be normal size, shape, and contour.  No adnexal masses or tenderness noted.Bladder is non tender, no masses felt. Nuswab obtained. Extremities/musculoskeletal:  No swelling or varicosities noted, no clubbing or cyanosis Psych:  No mood changes, alert and cooperative,seems happy PHQ 9 score 0.  Impression: 1. Encounter for well woman exam with routine gynecological exam   2. Screening examination for STD (sexually transmitted disease)   3. Pregnancy examination or test, negative  result       Plan: Nuswab sent Physical in 1 year Pap in 2021 Get labs with PCP Mammogram at 41

## 2017-08-17 LAB — NUSWAB VAGINITIS PLUS (VG+)
Candida albicans, NAA: NEGATIVE
Candida glabrata, NAA: NEGATIVE
Chlamydia trachomatis, NAA: NEGATIVE
Neisseria gonorrhoeae, NAA: NEGATIVE
Trich vag by NAA: NEGATIVE

## 2018-04-23 ENCOUNTER — Telehealth: Payer: Self-pay | Admitting: Adult Health

## 2018-04-23 NOTE — Telephone Encounter (Signed)
Left message @ 11:42 am. JSY

## 2018-04-23 NOTE — Telephone Encounter (Signed)
Patient called stating that she would like a call from either [facility] or a nurse. Pt states that she has concern about getting pregnant. Please contact pt

## 2018-04-23 NOTE — Telephone Encounter (Signed)
Left message @ 4:16 pm. JSY 

## 2018-04-24 NOTE — Telephone Encounter (Signed)
Spoke with pt. Pt wants to discuss infertility. Advised that can be scheduled as a webex visit. Pt voiced understanding and call was transferred to front desk for appt. JSY

## 2018-04-25 ENCOUNTER — Encounter: Payer: Self-pay | Admitting: Adult Health

## 2018-04-25 ENCOUNTER — Ambulatory Visit (INDEPENDENT_AMBULATORY_CARE_PROVIDER_SITE_OTHER): Payer: BLUE CROSS/BLUE SHIELD | Admitting: Adult Health

## 2018-04-25 ENCOUNTER — Other Ambulatory Visit: Payer: Self-pay

## 2018-04-25 DIAGNOSIS — Z319 Encounter for procreative management, unspecified: Secondary | ICD-10-CM

## 2018-04-25 NOTE — Progress Notes (Signed)
   TELEHEALTH VIRTUAL GYNECOLOGY VISIT ENCOUNTER NOTE  I connected with Kimberly [name] on 04/25/18 at  2:45 PM EDT by telephone at home and verified that I am speaking with the correct person using two identifiers.   I discussed the limitations, risks, security and privacy concerns of performing an evaluation and management service by telephone and the availability of in person appointments. I also discussed with the patient that there may be a patient responsible charge related to this service. The patient expressed understanding and agreed to proceed.   History:  Kimberly [name] is a 38 y.o. [id number] female being evaluated today because she would like to get pregnant.She has been with her partner for years, now.He had 1 child 12 years ago that died. She denies any abnormal vaginal discharge, bleeding, pelvic pain or other concerns.  Periods are regular.     Past Medical History:  Diagnosis Date  . Abnormal Pap smear   . Asthma   . BV (bacterial vaginosis) 05/03/2012  . Chlamydia   . Depression   . Gonorrhea 04/18/2016  . HPV (human papilloma virus) infection   . Irregular periods 03/06/2014  . Vaginal Pap smear, abnormal    Past Surgical History:  Procedure Laterality Date  . COLONOSCOPY N/A 02/20/2012   TAV:WPVXYIAX sized internal hemorrhoids/otherwise normal  . LEEP    . None     The following portions of the patient's history were reviewed and updated as appropriate: allergies, current medications, past family history, past medical history, past social history, past surgical history and problem list.   Health Maintenance:  Normal pap and negative HRHPV on 04/13/16, was +GC and was treated and had negative proof of cure.   Review of Systems:  Pertinent items noted in HPI and remainder of comprehensive ROS otherwise negative.  Physical Exam:   General:  Alert, oriented and cooperative.   Mental Status: Normal mood and affect perceived. Normal judgment and thought content.   Physical exam deferred due to nature of the encounter  Labs and Imaging No results found for this or any previous visit (from the past 336 hour(s)). No results found.    Assessment and Plan:     1. Patient desires pregnancy -call with next period and will check progesterone level on day 21 -take OTC PNV with folic acid She is aware that may need to get Semen analysis on partner and if not pregnant soon will refer to [facility]. Have sex every other day 7-24 of cycle.       I discussed the assessment and treatment plan with the patient. The patient was provided an opportunity to ask questions and all were answered. The patient agreed with the plan and demonstrated an understanding of the instructions.   The patient was advised to call back or seek an in-person evaluation/go to the ED if the symptoms worsen or if the condition fails to improve as anticipated.  I provided 10  minutes of non-face-to-face time during this encounter.   Cyril Mourning, NP Center for [employer], [facility]

## 2018-05-20 ENCOUNTER — Other Ambulatory Visit: Payer: Self-pay

## 2018-05-20 ENCOUNTER — Emergency Department (HOSPITAL_COMMUNITY)
Admission: EM | Admit: 2018-05-20 | Discharge: 2018-05-20 | Disposition: A | Discharge status: Home / Self Care | Payer: BLUE CROSS/BLUE SHIELD | Attending: Emergency Medicine | Admitting: Emergency Medicine

## 2018-05-20 ENCOUNTER — Encounter (HOSPITAL_COMMUNITY): Payer: Self-pay | Admitting: Emergency Medicine

## 2018-05-20 ENCOUNTER — Emergency Department (HOSPITAL_COMMUNITY): Payer: BLUE CROSS/BLUE SHIELD

## 2018-05-20 DIAGNOSIS — Y9301 Activity, walking, marching and hiking: Secondary | ICD-10-CM | POA: Diagnosis not present

## 2018-05-20 DIAGNOSIS — X500XXA Overexertion from strenuous movement or load, initial encounter: Secondary | ICD-10-CM | POA: Insufficient documentation

## 2018-05-20 DIAGNOSIS — Y998 Other external cause status: Secondary | ICD-10-CM | POA: Insufficient documentation

## 2018-05-20 DIAGNOSIS — Y92015 Private garage of single-family (private) house as the place of occurrence of the external cause: Secondary | ICD-10-CM | POA: Insufficient documentation

## 2018-05-20 DIAGNOSIS — S93401A Sprain of unspecified ligament of right ankle, initial encounter: Secondary | ICD-10-CM | POA: Insufficient documentation

## 2018-05-20 DIAGNOSIS — S99911A Unspecified injury of right ankle, initial encounter: Secondary | ICD-10-CM | POA: Diagnosis present

## 2018-05-20 DIAGNOSIS — F17210 Nicotine dependence, cigarettes, uncomplicated: Secondary | ICD-10-CM | POA: Insufficient documentation

## 2018-05-20 MED ORDER — KETOROLAC TROMETHAMINE 30 MG/ML IJ SOLN
30.0000 mg | Freq: Once | INTRAMUSCULAR | Status: AC
  Administered 2018-05-20: 18:00:00 30 mg via INTRAMUSCULAR
  Filled 2018-05-20: qty 1

## 2018-05-20 MED ORDER — ONDANSETRON 4 MG PO TBDP
4.0000 mg | ORAL_TABLET | Freq: Once | ORAL | Status: AC
  Administered 2018-05-20: 4 mg via ORAL
  Filled 2018-05-20: qty 1

## 2018-05-20 MED ORDER — MORPHINE SULFATE (PF) 4 MG/ML IV SOLN
4.0000 mg | Freq: Once | INTRAVENOUS | Status: AC
  Administered 2018-05-20: 17:00:00 4 mg via INTRAMUSCULAR
  Filled 2018-05-20: qty 1

## 2018-05-20 MED ORDER — HYDROCODONE-ACETAMINOPHEN 5-325 MG PO TABS: 1.0000 | ORAL_TABLET | ORAL | 0 refills | Status: DC | PRN

## 2018-05-20 MED ORDER — IBUPROFEN 600 MG PO TABS: 600.0000 mg | ORAL_TABLET | Freq: Four times a day (QID) | ORAL | 0 refills | Status: DC | PRN

## 2018-05-20 NOTE — ED Provider Notes (Signed)
[facility] EMERGENCY DEPARTMENT Provider Note   CSN: [id number] Arrival date & time: 05/20/18  1703    History   Chief Complaint Chief Complaint  Patient presents with  . Foot Injury    HPI Kimberly [name] is a 38 y.o. female.     Pt presents to the ED today with right foot pain.  The pt jumped up to close her garage door around 11:30 am when she rolled her ankle.  The pt said it did not hurt too badly initially, and she was able to drive to [place].  She said it started hurting about 1 hour ago.  She took her mom's lortab and it did not help.     Past Medical History:  Diagnosis Date  . Abnormal Pap smear   . Asthma   . BV (bacterial vaginosis) 05/03/2012  . Chlamydia   . Depression   . Gonorrhea 04/18/2016  . HPV (human papilloma virus) infection   . Irregular periods 03/06/2014  . Vaginal Pap smear, abnormal     Patient Active Problem List   Diagnosis Date Noted  . Gonorrhea 04/18/2016  . Screening for colorectal cancer 04/13/2016  . Bowel habit changes 04/13/2016  . History of rectal bleeding 04/13/2016  . Encounter for gynecological examination with Papanicolaou smear of cervix 04/13/2016  . Abdominal pain 03/24/2016  . Constipation 03/24/2016  . Irregular periods 03/06/2014  . BV (bacterial vaginosis) 05/03/2012  . Asthma 04/16/2012  . Abnormal pap 04/16/2012  . Abdominal bloating 02/18/2012  . Rectal bleeding 02/18/2012    Past Surgical History:  Procedure Laterality Date  . COLONOSCOPY N/A 02/20/2012   EBR:AXENMMHW sized internal hemorrhoids/otherwise normal  . LEEP    . None       OB History    Gravida  2   Para  2   Term  2   Preterm      AB      Living  2     SAB      TAB      Ectopic      Multiple      Live Births  2            Home Medications    Prior to Admission medications   Medication Sig Start Date End Date Taking? Authorizing Provider  HYDROcodone-acetaminophen (NORCO/VICODIN) 5-325 MG tablet Take 1  tablet by mouth every 4 (four) hours as needed. 05/20/18   Jacalyn Lefevre, MD  ibuprofen (ADVIL) 600 MG tablet Take 1 tablet (600 mg total) by mouth every 6 (six) hours as needed. 05/20/18   Jacalyn Lefevre, MD    Family History Family History  Problem Relation Age of Onset  . Hypertension Paternal Grandmother   . Diabetes Maternal Grandmother   . Hypertension Maternal Grandmother   . Breast cancer Maternal Grandmother   . Cancer Paternal Grandfather   . Diabetes Maternal Grandfather   . Cancer Maternal Grandfather   . Hypertension Mother   . Diabetes Mother   . Diabetes Maternal Uncle   . Diabetes Maternal Uncle   . Colon cancer Neg Hx     Social History Social History   Tobacco Use  . Smoking status: Current Some Day Smoker    Packs/day: 0.00    Years: 10.00    Pack years: 0.00    Types: Cigarettes  . Smokeless tobacco: Never Used  . Tobacco comment: smokes 4-6 when she smokes  Substance Use Topics  . Alcohol use: No  . Drug  use: Not Currently    Types: Marijuana    Comment: rarely     Allergies   Patient has no known allergies.   Review of Systems Review of Systems  Musculoskeletal:       Right ankle pain  All other systems reviewed and are negative.    Physical Exam Updated Vital Signs BP 116/82 (BP Location: Left Arm)   Pulse 64   Temp (!) 97.5 F (36.4 C) (Oral)   Resp 16   Ht  (1.676 m)   Wt 68 kg   LMP 05/14/2018   SpO2 95%   BMI 24.21 kg/m   Physical Exam Vitals signs and nursing note reviewed.  Constitutional:      Appearance: Normal appearance.  HENT:     Head: Normocephalic and atraumatic.     Right Ear: External ear normal.     Left Ear: External ear normal.     Nose: Nose normal.     Mouth/Throat:     Mouth: Mucous membranes are moist.  Eyes:     Extraocular Movements: Extraocular movements intact.     Conjunctiva/sclera: Conjunctivae normal.     Pupils: Pupils are equal, round, and reactive to light.  Neck:      Musculoskeletal: Normal range of motion and neck supple.  Cardiovascular:     Rate and Rhythm: Normal rate and regular rhythm.     Pulses: Normal pulses.     Heart sounds: Normal heart sounds.  Pulmonary:     Effort: Pulmonary effort is normal.     Breath sounds: Normal breath sounds.  Abdominal:     General: Abdomen is flat. Bowel sounds are normal.     Palpations: Abdomen is soft.  Musculoskeletal:       Feet:  Skin:    General: Skin is warm and dry.     Capillary Refill: Capillary refill takes less than 2 seconds.  Neurological:     General: No focal deficit present.     Mental Status: She is alert and oriented to person, place, and time.  Psychiatric:        Mood and Affect: Mood normal.        Behavior: Behavior normal.      ED Treatments / Results  Labs (all labs ordered are listed, but only abnormal results are displayed) Labs Reviewed - No data to display  EKG None  Radiology Dg Foot Complete Right  Result Date: 05/20/2018 CLINICAL DATA:  38 year old female landed wrong on her right foot. Lateral pain and swelling. EXAM: RIGHT FOOT COMPLETE - 3+ VIEW COMPARISON:  None. FINDINGS: Bone mineralization is within normal limits. There is no evidence of fracture or dislocation. There is no evidence of arthropathy or other focal bone abnormality. No discrete soft tissue injury. IMPRESSION: No acute fracture or dislocation identified about the right foot. Electronically Signed   By: Odessa Fleming M.D.   On: 05/20/2018 17:54    Procedures Procedures (including critical care time)  Medications Ordered in ED Medications  ketorolac (TORADOL) 30 MG/ML injection 30 mg (has no administration in time range)  morphine 4 MG/ML injection 4 mg (4 mg Intramuscular Given 05/20/18 1728)  ondansetron (ZOFRAN-ODT) disintegrating tablet 4 mg (4 mg Oral Given 05/20/18 1728)     Initial Impression / Assessment and Plan / ED Course  I have reviewed the triage vital signs and the nursing notes.   Pertinent labs & imaging results that were available during my care of the patient were reviewed  by me and considered in my medical decision making (see chart for details).       Xray neg for fx.  Pt placed in ASO splint and given crutches.  She is d/c home with instructions to f/u with ortho.  Final Clinical Impressions(s) / ED Diagnoses   Final diagnoses:  Sprain of right ankle, unspecified ligament, initial encounter    ED Discharge Orders         Ordered    ibuprofen (ADVIL) 600 MG tablet  Every 6 hours PRN     05/20/18 1801    HYDROcodone-acetaminophen (NORCO/VICODIN) 5-325 MG tablet  Every 4 hours PRN     05/20/18 1801           Jacalyn LefevreHaviland, Cinda Hara, MD 05/20/18 1804

## 2018-05-20 NOTE — ED Triage Notes (Signed)
Pt states she jumped up to close the garage door and came down on her right foot.

## 2018-05-20 NOTE — ED Notes (Signed)
Patient requesting one leg scooter for her ankle. I told her that we did not have those here. Patient asked for prescription for scooter. Told pt she would be going home on crutches and ankle support brace. Patient noncompliant with BP cuff, would not keep arm down out of the air. Patient called sister to get a ride home. Walked pt to waiting room.

## 2018-05-20 NOTE — ED Triage Notes (Signed)
Call to remove pt from car due to "broken hip" Upon stretcher to car, pt inside at registration   Family disruption present with pt yelling and holding her R leg in the air stating that it is her foot and that "it feels better up in the air"  Pt stood and spun on L foot getting onto the stretcher and moved to room one   Pt foot elevated and iced  Slight swelling with no obvious deformity noted to foot ankle area where she points

## 2018-05-20 NOTE — ED Notes (Signed)
Pt states she took her aunt's hydrocodone and her mom's muscle relaxers pta.

## 2018-07-02 ENCOUNTER — Telehealth: Payer: Self-pay | Admitting: Adult Health

## 2018-07-02 DIAGNOSIS — Z319 Encounter for procreative management, unspecified: Secondary | ICD-10-CM

## 2018-07-02 NOTE — Telephone Encounter (Signed)
Patient called stating that "I was told to let Kimberly [name] know when my period started on my last visit it started today 06/29/2018" Please contact pt

## 2018-07-02 NOTE — Telephone Encounter (Signed)
Check Progesterone level July 9, LMP 6/19, order in, but you may want to call for appt.

## 2018-07-02 NOTE — Addendum Note (Signed)
Addended by: Derrek Monaco A on: 07/02/2018 10:32 AM   Modules accepted: Orders

## 2018-07-18 ENCOUNTER — Telehealth: Payer: Self-pay

## 2018-07-18 NOTE — Telephone Encounter (Signed)
Pt noticed some changes in her bowels. Pt noticed blood and mucous in stool. Stool was formed, no diarrhea. No diarrhea hx of hemorrhoids. Pt wanted to know if she needed to pick up a stool kit prior to her appointment to determine if there was blood in her stool.  Pt scheduled for next Wednesday 07/25/18 with KP.

## 2018-07-19 LAB — PROGESTERONE: Progesterone: 17.2 ng/mL

## 2018-07-19 NOTE — Telephone Encounter (Signed)
Spoke with pt. She is going to have her sister pick up the IFOBT kit due to her not being able to get here due to work.

## 2018-07-19 NOTE — Telephone Encounter (Signed)
Yes, let's have her pick up and complete an iFOBT and keep appointment with [facility] next week.

## 2018-07-24 NOTE — Progress Notes (Addendum)
REVIEWED-NO ADDITIONAL RECOMMENDATIONS.  Referring Provider: Rosita Fire, MD Primary Care Physician:  Rosita Fire, MD  Primary GI Physician: Dr. Oneida Alar  Chief Complaint  Patient presents with   Constipation    Passing mucus and blood from rectum    HPI:   Kimberly [name] is a 38 y.o. female with a history of constipation, lower abdominal pain, and rectal bleeding suspected secondary to hemorrhoids. Patient is presenting today with chief complaint of constipation and passing blood from rectum.   Last colonoscopy in 2014 for rectal bleeding, constipation, and abdominal pain. Fair prep in right colon, good prep otherwise. Impression with rectal bleeding due to internal hemorrhoids. Recommended high fiber diet, anusol cream BID x 12 days, consider repeat TCS with peds scope and overtube to clear right colon in 6 months.   Patient was last seen in our office on 03/24/2016 for rectal bleeding, lower abdominal pain, and constipation. BMs at that time with blood mixed in most every time, mucusy, lumpy bristol 2-3. Recommended Linzess daily, anusol cream for hemorrhoids, and patient was to complete DG abdomen. DG abdomen on 03/25/18 with moderate to large amount of feces throughout colon.   Patient called on 7/8 reporting blood and mucous in stool. Stool was formed. No diarrhea and history of hemorrhoids. She completed IFOBT and returned with her today. IFOBT positive.    Of note, patient is a difficult historian. Today she states she continues to have rectal bleeding. States it has never stopped since she saw [place] last time, she just never came back. States it is when her hemorrhoids are flared. Feels like there is "intense pressure" in her rectum when her hemorrhoids flare and when she passes stool, most every time there is blood on the stool and on the toilet tissue. No blood in the water. Bleeding with BMs about 2 days a week. No prolapsing hemorrhoid tissue. No knife like pain. Thinks if  she has too much beef she will also have bleeding. BMs [place] 3 and 5. Daily BMs. Maybe 2 a day. Is not taking anything to help with BMs or hemorrhoids. Drinks a lot of water. 2-3 times a month with straining and small hard stools. When she is having to strain with BMs, she will have "tightening" in her lower abdomen and it will feel like, "the stools are scraping through my bowels." This resolves after she passes the stool. States she has realized she needs to take better care of her body and she has been trying to make changes and be healthier. Reports the most recent incident when she called the office, she was having to strain really hard to have a BM and tried digital manipulation to get the stool to come out. Now her bowel movements are back to baseline. States she doesn't like to take medications because she doesn't want her body to get dependent on them.   Reports a 35 lb weight loss since April. At first states she hasn't made any changes that she prayed to God and asked to "make her belly go down." Then when questioned further, she states she has been eating healthier, avoiding junk food, and exercising regularly. Was going to the gym after her appointment today. Her Appetite is still good. Looking at our system, it shows 42 lb weight loss since August 2019 and 17 lbs since May 2020.   No reflux or heartburn since quitting smoking in April. No dysphagia, N/V, or other abdominal pain. Denies fever, chills, fatigue, lightheadedness, dizziness, pre-syncope, and  syncope. No chest pain, palpitations, shortness of breath, cough, or wheezing.   History of marijuana use. States she last used 2 months ago.   Past Medical History:  Diagnosis Date   Abnormal Pap smear    Asthma    BV (bacterial vaginosis) 05/03/2012   Chlamydia    Depression    Gonorrhea 04/18/2016   HPV (human papilloma virus) infection    Irregular periods 03/06/2014   Vaginal Pap smear, abnormal     Past Surgical  History:  Procedure Laterality Date   COLONOSCOPY N/A 02/20/2012   WUJ:WJXBJYNWSLF:Moderate sized internal hemorrhoids/otherwise normal   LEEP     None      Current Outpatient Medications  Medication Sig Dispense Refill   hydrocortisone (ANUSOL-HC) 25 MG suppository Place 1 suppository (25 mg total) rectally every 12 (twelve) hours. 12 suppository 1   No current facility-administered medications for this visit.     Allergies as of 07/25/2018   (No Known Allergies)    Family History  Problem Relation Age of Onset   Hypertension Paternal Grandmother    Diabetes Maternal Grandmother    Hypertension Maternal Grandmother    Breast cancer Maternal Grandmother    Cancer Paternal Grandfather    Diabetes Maternal Grandfather    Cancer Maternal Grandfather    Hypertension Mother    Diabetes Mother    Diabetes Maternal Uncle    Diabetes Maternal Uncle    Colon cancer Neg Hx     Social History   Socioeconomic History   Marital status: Single    Spouse name: Not on file   Number of children: Not on file   Years of education: Not on file   Highest education level: Not on file  Occupational History   Not on file  Social Needs   Financial resource strain: Not on file   Food insecurity    Worry: Not on file    Inability: Not on file   Transportation needs    Medical: Not on file    Non-medical: Not on file  Tobacco Use   Smoking status: Former Smoker    Packs/day: 0.00    Years: 10.00    Pack years: 0.00    Types: Cigarettes    Start date: 04/2018   Smokeless tobacco: Never Used   Tobacco comment: smokes 4-6 when she smokes  Substance and Sexual Activity   Alcohol use: Yes    Comment: rarely   Drug use: Not Currently    Types: Marijuana    Comment: rarely   Sexual activity: Yes    Birth control/protection: None  Lifestyle   Physical activity    Days per week: Not on file    Minutes per session: Not on file   Stress: Not on file    Relationships   Social connections    Talks on phone: Not on file    Gets together: Not on file    Attends religious service: Not on file    Active member of club or organization: Not on file    Attends meetings of clubs or organizations: Not on file    Relationship status: Not on file  Other Topics Concern   Not on file  Social History Narrative   Not on file    Review of Systems: Gen: See HPI CV: Denies peripheral edema. Resp: See HPI GI: Denies vomiting blood, jaundice, and fecal incontinence.   Denies dysphagia or odynophagia. Derm: Denies rash, itching, dry skin Psych: Denies depression, anxiety  Heme:  Denies other bruising or bleeding  Physical Exam: BP 130/81    Pulse 80    Temp (!) 97.5 F (36.4 C)    Ht 5\' 7"  (1.702 m)    Wt 132 lb 3.2 oz (60 kg)    LMP 07/25/2018 (Exact Date)    BMI 20.71 kg/m  General:   Alert and oriented. No distress noted. Pleasant and cooperative.  Head:  Normocephalic and atraumatic. Eyes:  Conjuctiva clear without scleral icterus. Heart:  S1, S2 present without murmurs appreciated. Lungs:  Clear to auscultation bilaterally. No wheezes, rales, or rhonchi. No distress.  Abdomen:  +BS, soft, non-tender and non-distended. No rebound or guarding. No HSM or masses noted. Rectal: Deferred to time of colonoscopy.  Msk:  Symmetrical without gross deformities. Normal posture. Extremities:  Without edema. Neurologic:  Alert and  oriented x4 Psych:  Alert and cooperative. Normal mood and affect.

## 2018-07-25 ENCOUNTER — Encounter: Payer: Self-pay | Admitting: Gastroenterology

## 2018-07-25 ENCOUNTER — Other Ambulatory Visit: Payer: Self-pay | Admitting: *Deleted

## 2018-07-25 ENCOUNTER — Ambulatory Visit: Payer: BLUE CROSS/BLUE SHIELD | Admitting: Gastroenterology

## 2018-07-25 ENCOUNTER — Encounter: Payer: Self-pay | Admitting: *Deleted

## 2018-07-25 ENCOUNTER — Ambulatory Visit (INDEPENDENT_AMBULATORY_CARE_PROVIDER_SITE_OTHER): Payer: BC Managed Care – PPO | Admitting: Gastroenterology

## 2018-07-25 ENCOUNTER — Telehealth: Payer: Self-pay | Admitting: Adult Health

## 2018-07-25 ENCOUNTER — Other Ambulatory Visit: Payer: Self-pay

## 2018-07-25 VITALS — BP 130/81 | HR 80 | Temp 97.5°F | Ht 67.0 in | Wt 132.2 lb

## 2018-07-25 DIAGNOSIS — K625 Hemorrhage of anus and rectum: Secondary | ICD-10-CM

## 2018-07-25 DIAGNOSIS — K648 Other hemorrhoids: Secondary | ICD-10-CM | POA: Diagnosis not present

## 2018-07-25 DIAGNOSIS — K5900 Constipation, unspecified: Secondary | ICD-10-CM | POA: Diagnosis not present

## 2018-07-25 LAB — IFOBT (OCCULT BLOOD): IFOBT: POSITIVE

## 2018-07-25 MED ORDER — PEG 3350-KCL-NA BICARB-NACL 420 G PO SOLR: 4000.0000 mL | Freq: Once | ORAL | 0 refills | Status: AC

## 2018-07-25 MED ORDER — HYDROCORTISONE ACETATE 25 MG RE SUPP: 25.0000 mg | Freq: Two times a day (BID) | RECTAL | 1 refills | Status: DC

## 2018-07-25 NOTE — Patient Instructions (Addendum)
We will get you scheduled for colonoscopy in the near future with Dr. Lynn Ito will have 2 days of clear liquids rather than 1 day.   Please come by the office a couple week before your procedure to pick up samples of Linzess 145 mcg. You will take this for 3 days prior to starting your colon prep.   Use Anusol suppository twice a day for up to 10 days.   Avoid straining and limit toilet time to 2-3 minutes  Please start taking over the counter fiber supplement to help with occasional straining. Metamucil or benefiber is a good choice. Start with low dose and increase slowly.   We will see you back in 3-4 months.

## 2018-07-25 NOTE — Telephone Encounter (Signed)
Pt would like a call to discuss the next steps for her are since her progesterone levels have came back. She states she has been actively trying to get pregnant since her mirena was removed in 2015 and she would like to see what else she can do.

## 2018-07-26 ENCOUNTER — Encounter: Payer: Self-pay | Admitting: Gastroenterology

## 2018-07-26 NOTE — Assessment & Plan Note (Addendum)
38 y.o. female with history of lower abdominal pain, constipation, internal hemorrhoids, and bright red blood per rectum. Patient has continued to have regular rectal bleeding with BMs about 2 days a week since she saw [place] in 2018. Blood is on the stool and toilet tissue. Feels pressure in her rectum with hemorrhoid flares. No prolapsing tissue. BMs have improved since she has adapted to a healthier life style. BMs 1-2 daily bristol 3 or 5. Straining with hard, small stools, and associated lower abdominal "tightening" about 2-3 times a month. Abdominal pain relieved after BM. No other GI symptoms at this time. Reports 35 lb weight loss since April. Our system shows 42 lb weight loss since August 2019 and 17 lbs since May 2020. She has made significant changes in diet and lifestyle. Appetite is good. No lightheadedness, dizziness, pre-syncope, or syncope. TSH and celiac testing normal in 2014. IFOBT returned today and is positive. Last colonoscopy  2014 with fair prep in right colon, good prep otherwise. Impression with rectal bleeding due to internal hemorrhoids. Suspect ongoing bleeding is from hemorrhoids. However, not able to rule out other etiologies including polyps or malignancy.   Discussed possibility of banding of internal hemorrhoids at time of colonoscopy. Patient is interested in this and understands Dr. Oneida Alar will look during the procedure and decide if banding is appropriate.   Proceed with TCS with propofol with Dr. Oneida Alar in the near future. Propofol due to history of marijuana use. Discussed risks and benefits of TCS and hemorrhoid banding with patient. Patient states her understanding and agrees to proceed.  Recommendations on last colonoscopy with "consider repeat TCS with peds scope and overtube to clear right colon."  Due to poor prep, I have advised patient to take 3 days of Linzess 145 mcg prior to procedure. She is to pick these samples up at the office the week before the procedure as  we are out at the moment. She will also have an extra day of clear liquids.   Follow-up after procedure.

## 2018-07-26 NOTE — Assessment & Plan Note (Addendum)
Patient with history of constipation. However, she has made changes to her diet, is now eating healthier, exercising regularly, and is having 1-2 bowel movements daily. About 2-3 times a month, she will have straining with difficulty passing stools that are hard and small. Associated "tightening" in lower abdomen at that time and feeling like "the stools are scraping through my bowels."  Also with bright red blood per rectum about 2 days a week with known internal hemorrhoids on colonoscopy on 2014. Patient never took Linzess that was prescribed in the past as she doesn't like to take medications.   At this time I have advised patient to add fiber supplement to her diet daily to help prevent constipation. Benefiber or Metamucil. If this doesn't help, we can discuss other options at follow-up. Patient was agreeable to this.  Patient will also have TCS with Dr. Oneida Alar with possible hemorrhoid banding as described above.

## 2018-07-26 NOTE — Assessment & Plan Note (Signed)
Patient with known internal hemorrhoids noted on colonoscopy in 2014. States she has continued to have hemorrhoid flares regularly since she saw [place] in 2018 with rectal bleeding about 2 days a week. No prolapsing tissue. Feels like intense pressure in rectum. No sharp knife like pain. Patient has never used the Anusol cream that was prescribed in the past.   Anusol suppositories twice daily for up to 10 days.  Fiber supplement daily. Metamucil or benefiber.  Limit toilet time to 2-3 minutes TCS with Dr. Oneida Alar in near future with possible hemorrhoid banding as described below.

## 2018-07-27 ENCOUNTER — Telehealth: Payer: Self-pay | Admitting: Gastroenterology

## 2018-07-27 NOTE — Telephone Encounter (Signed)
Spoke with patient. See other phone note.

## 2018-07-27 NOTE — Telephone Encounter (Signed)
Patient called back this morning, before I saw this message.  Please call her back when you can

## 2018-07-27 NOTE — Telephone Encounter (Signed)
Kimberly [name]: Patient is scheduled for colonoscopy on 7/27. I had discussed with patient possible hemorrhoid banding and just realized I did not put this on the encounter form. Also, in Dr. Oneida Alar last colonoscopy report she stated "consider repeat TCS with peds scope and overtube to clear right colon."

## 2018-07-27 NOTE — Telephone Encounter (Signed)
Samples are at front for pick up #12 . The info has been written on the bag for all instructions.  Pt's sister, who is on her DRF will pick up at front.

## 2018-07-27 NOTE — Telephone Encounter (Signed)
Called patient to speak about upcoming procedure and samples to be used prior to colon prep. Patients mother answered and said patient was at work but would send her a text message and let her know to call us. When patient calls back, it is ok to transfer back to me. If I am unavailable, can you ask when is a good time for me to call back?  Nathaniel Man

## 2018-07-27 NOTE — Telephone Encounter (Signed)
Spoke with patient about picking up samples for her to take prior to her colonoscopy. Originally was going to be Linzess; however, we do not have samples. She is agreeable to taking Amitiza. We will provide Amitiza 24 mcg samples. Patient is to start taking on Monday 7/21 and stop on 7/25. She needs to take this with food twice a day. If she starts having a lot of bowel movements or a lot of diarrhea, she can hold a dose and resume at the next dose. If bowel continue to be very loose, can decrease to once a day. The goal is not to clean her out with Amitiza, but to get her bowels moving well before the prep.   Patient is going to have her sister pick the samples up for her today. Can we get samples and place them at the front? She only needs enough for 5 days.

## 2018-07-30 ENCOUNTER — Other Ambulatory Visit: Payer: Self-pay

## 2018-07-30 DIAGNOSIS — K648 Other hemorrhoids: Secondary | ICD-10-CM

## 2018-07-30 DIAGNOSIS — K625 Hemorrhage of anus and rectum: Secondary | ICD-10-CM

## 2018-07-30 NOTE — Telephone Encounter (Signed)
Endo scheduler informed and cancelled previous orders. New orders entered.

## 2018-07-30 NOTE — Progress Notes (Signed)
cc'ed to pcp °

## 2018-07-30 NOTE — Telephone Encounter (Signed)
Call could not go through, if she calls back was going to give her number for [facility] 1 [phone]

## 2018-07-31 ENCOUNTER — Telehealth: Payer: Self-pay | Admitting: *Deleted

## 2018-07-31 ENCOUNTER — Telehealth: Payer: Self-pay | Admitting: Gastroenterology

## 2018-07-31 NOTE — Telephone Encounter (Signed)
Routing to [employer] and [employer]

## 2018-07-31 NOTE — Telephone Encounter (Signed)
Pt wants to discuss lab results.  [phone]

## 2018-07-31 NOTE — Telephone Encounter (Signed)
SHE WAS MOST RECENTLY SEEN BY KRISTEN OR ERIC G CAN FILL IT OUT.

## 2018-07-31 NOTE — Telephone Encounter (Signed)
Pt is scheduled a covid test on 7/24 and procedure with SF on 7/27. Patient is faxing FMLA papers to [place] for her work and will need them back by Friday. I told her it would be a $29 fee to complete and the office will be closed this Friday.  She will be faxing the forms to [place] today and will need them back tomorrow. Please advise who needs to complete the forms.

## 2018-08-01 NOTE — Telephone Encounter (Signed)
Neither Randall Hiss or myself have received FMLA paperwork to fill out for this patient. Did patient ever fax the paperwork?

## 2018-08-01 NOTE — Telephone Encounter (Signed)
PT was inquiring about her iFOBT results. She is aware that it was positive and she will keep apt for her colonoscopy on 08/06/2018.

## 2018-08-02 NOTE — Telephone Encounter (Signed)
Nothing has been faxed over yet The day she called she was given the fax number.

## 2018-08-02 NOTE — Patient Instructions (Signed)
   Your procedure is scheduled on: 08/06/2018  Report to [facility] at  9:30   AM.  Call this number if you have problems the morning of surgery: [phone]   Remember:              Follow Directions on the letter you received from Your Physician's office regarding the Bowel Prep  :    Do not wear jewelry, make-up or nail polish.    Do not bring valuables to the hospital.  Contacts, dentures or bridgework may not be worn into surgery.  .   Patients discharged the day of surgery will not be allowed to drive home.     Colonoscopy, Adult, Care After This sheet gives you information about how to care for yourself after your procedure. Your health care provider may also give you more specific instructions. If you have problems or questions, contact your health care provider. What can I expect after the procedure? After the procedure, it is common to have:  A small amount of blood in your stool for 24 hours after the procedure.  Some gas.  Mild abdominal cramping or bloating.  Follow these instructions at home: General instructions   For the first 24 hours after the procedure: ? Do not drive or use machinery. ? Do not sign important documents. ? Do not drink alcohol. ? Do your regular daily activities at a slower pace than normal. ? Eat soft, easy-to-digest foods. ? Rest often.  Take over-the-counter or prescription medicines only as told by your health care provider.  It is up to you to get the results of your procedure. Ask your health care provider, or the department performing the procedure, when your results will be ready. Relieving cramping and bloating  Try walking around when you have cramps or feel bloated.  Apply heat to your abdomen as told by your health care provider. Use a heat source that your health care provider recommends, such as a moist heat pack or a heating pad. ? Place a towel between your skin and the heat source. ? Leave the heat on for 20-30  minutes. ? Remove the heat if your skin turns bright red. This is especially important if you are unable to feel pain, heat, or cold. You may have a greater risk of getting burned. Eating and drinking  Drink enough fluid to keep your urine clear or pale yellow.  Resume your normal diet as instructed by your health care provider. Avoid heavy or fried foods that are hard to digest.  Avoid drinking alcohol for as long as instructed by your health care provider. Contact a health care provider if:  You have blood in your stool 2-3 days after the procedure. Get help right away if:  You have more than a small spotting of blood in your stool.  You pass large blood clots in your stool.  Your abdomen is swollen.  You have nausea or vomiting.  You have a fever.  You have increasing abdominal pain that is not relieved with medicine. This information is not intended to replace advice given to you by your health care provider. Make sure you discuss any questions you have with your health care provider. Document Released: 08/11/2003 Document Revised: 09/21/2015 Document Reviewed: 03/10/2015 Elsevier Interactive Patient Education  [employer].

## 2018-08-02 NOTE — Telephone Encounter (Signed)
Pt called office to see if FMLA paper work was received. Informed her per SS note this morning papers have not been received. She went to HR dept and re-faxed papers. FMLA papers received. JL advised pt can be billed the $29 fee. Papers given to EG/KH. Advised pt that provider will do what they can to complete papers and fax. Provider is usually given at least a week to complete papers. Verbalized understanding. States papers are for 08/03/18-08/06/18 d/t she has pre-op tomorrow and COVID test then will need to quarantine until procedure 08/06/18.

## 2018-08-03 ENCOUNTER — Encounter (HOSPITAL_COMMUNITY): Payer: Self-pay

## 2018-08-03 ENCOUNTER — Encounter (HOSPITAL_COMMUNITY)
Admission: RE | Admit: 2018-08-03 | Discharge: 2018-08-03 | Disposition: A | Discharge status: Home / Self Care | Payer: BC Managed Care – PPO | Source: Ambulatory Visit | Attending: Gastroenterology | Admitting: Gastroenterology

## 2018-08-03 ENCOUNTER — Other Ambulatory Visit (HOSPITAL_COMMUNITY)
Admission: RE | Admit: 2018-08-03 | Discharge: 2018-08-03 | Disposition: A | Discharge status: Home / Self Care | Payer: BC Managed Care – PPO | Source: Ambulatory Visit | Attending: Gastroenterology | Admitting: Gastroenterology

## 2018-08-03 ENCOUNTER — Other Ambulatory Visit (HOSPITAL_COMMUNITY): Payer: BC Managed Care – PPO

## 2018-08-03 ENCOUNTER — Other Ambulatory Visit: Payer: BC Managed Care – PPO

## 2018-08-03 ENCOUNTER — Other Ambulatory Visit: Payer: Self-pay

## 2018-08-03 DIAGNOSIS — Z01812 Encounter for preprocedural laboratory examination: Secondary | ICD-10-CM | POA: Diagnosis not present

## 2018-08-03 DIAGNOSIS — Z1159 Encounter for screening for other viral diseases: Secondary | ICD-10-CM | POA: Insufficient documentation

## 2018-08-03 LAB — HCG, SERUM, QUALITATIVE: Preg, Serum: NEGATIVE

## 2018-08-03 NOTE — Telephone Encounter (Signed)
Papers completed by KH/EG and faxed back. Left copies with sucessful transmission notice from the fax with SS for billing.

## 2018-08-04 LAB — SARS CORONAVIRUS 2 (TAT 6-24 HRS): SARS Coronavirus 2: NEGATIVE

## 2018-08-06 ENCOUNTER — Encounter (HOSPITAL_COMMUNITY)
Admission: RE | Disposition: A | Discharge status: Home / Self Care | Payer: Self-pay | Source: Ambulatory Visit | Attending: Gastroenterology

## 2018-08-06 ENCOUNTER — Encounter (HOSPITAL_COMMUNITY): Payer: Self-pay | Admitting: Anesthesiology

## 2018-08-06 ENCOUNTER — Encounter (HOSPITAL_COMMUNITY): Payer: Self-pay

## 2018-08-06 ENCOUNTER — Other Ambulatory Visit: Payer: Self-pay

## 2018-08-06 ENCOUNTER — Telehealth: Payer: Self-pay | Admitting: Gastroenterology

## 2018-08-06 ENCOUNTER — Ambulatory Visit (HOSPITAL_COMMUNITY): Admit: 2018-08-06 | Payer: BC Managed Care – PPO | Admitting: Gastroenterology

## 2018-08-06 ENCOUNTER — Ambulatory Visit (HOSPITAL_COMMUNITY)
Admission: RE | Admit: 2018-08-06 | Discharge: 2018-08-06 | Disposition: A | Discharge status: Home / Self Care | Payer: BC Managed Care – PPO | Source: Ambulatory Visit | Attending: Gastroenterology | Admitting: Gastroenterology

## 2018-08-06 ENCOUNTER — Ambulatory Visit (HOSPITAL_COMMUNITY): Payer: BC Managed Care – PPO | Admitting: Anesthesiology

## 2018-08-06 DIAGNOSIS — Z87891 Personal history of nicotine dependence: Secondary | ICD-10-CM | POA: Diagnosis not present

## 2018-08-06 DIAGNOSIS — K921 Melena: Secondary | ICD-10-CM

## 2018-08-06 DIAGNOSIS — Q438 Other specified congenital malformations of intestine: Secondary | ICD-10-CM | POA: Diagnosis not present

## 2018-08-06 DIAGNOSIS — K648 Other hemorrhoids: Secondary | ICD-10-CM | POA: Insufficient documentation

## 2018-08-06 DIAGNOSIS — R634 Abnormal weight loss: Secondary | ICD-10-CM

## 2018-08-06 DIAGNOSIS — K625 Hemorrhage of anus and rectum: Secondary | ICD-10-CM

## 2018-08-06 DIAGNOSIS — J45909 Unspecified asthma, uncomplicated: Secondary | ICD-10-CM | POA: Insufficient documentation

## 2018-08-06 DIAGNOSIS — R1030 Lower abdominal pain, unspecified: Secondary | ICD-10-CM

## 2018-08-06 HISTORY — PX: COLONOSCOPY WITH PROPOFOL: SHX5780

## 2018-08-06 HISTORY — PX: HEMORRHOID BANDING: SHX5850

## 2018-08-06 SURGERY — COLONOSCOPY WITH PROPOFOL
Anesthesia: Monitor Anesthesia Care

## 2018-08-06 MED ORDER — LACTATED RINGERS IV SOLN
INTRAVENOUS | Status: DC
  Administered 2018-08-06: 10:00:00 via INTRAVENOUS

## 2018-08-06 MED ORDER — ONDANSETRON 4 MG PO TBDP
4.0000 mg | ORAL_TABLET | Freq: Once | ORAL | Status: AC
  Administered 2018-08-06: 4 mg via ORAL

## 2018-08-06 MED ORDER — CHLORHEXIDINE GLUCONATE CLOTH 2 % EX PADS: 6.0000 | MEDICATED_PAD | Freq: Once | CUTANEOUS | Status: DC

## 2018-08-06 MED ORDER — PROPOFOL 500 MG/50ML IV EMUL
INTRAVENOUS | Status: DC | PRN
  Administered 2018-08-06: 150 ug/kg/min via INTRAVENOUS

## 2018-08-06 MED ORDER — PROPOFOL 10 MG/ML IV BOLUS
INTRAVENOUS | Status: DC | PRN
  Administered 2018-08-06: 20 mg via INTRAVENOUS
  Administered 2018-08-06: 30 mg via INTRAVENOUS
  Administered 2018-08-06: 20 mg via INTRAVENOUS

## 2018-08-06 MED ORDER — MIDAZOLAM HCL 2 MG/2ML IJ SOLN: 0.5000 mg | Freq: Once | INTRAMUSCULAR | Status: DC | PRN

## 2018-08-06 MED ORDER — ONDANSETRON 4 MG PO TBDP
ORAL_TABLET | ORAL | Status: AC
  Filled 2018-08-06: qty 1

## 2018-08-06 MED ORDER — KETAMINE HCL 50 MG/5ML IJ SOSY
PREFILLED_SYRINGE | INTRAMUSCULAR | Status: AC
  Filled 2018-08-06: qty 5

## 2018-08-06 MED ORDER — HYDROCODONE-ACETAMINOPHEN 7.5-325 MG PO TABS: 1.0000 | ORAL_TABLET | Freq: Once | ORAL | Status: DC | PRN

## 2018-08-06 MED ORDER — HYDROMORPHONE HCL 1 MG/ML IJ SOLN: 0.2500 mg | INTRAMUSCULAR | Status: DC | PRN

## 2018-08-06 MED ORDER — LUBIPROSTONE 8 MCG PO CAPS: 8.0000 ug | ORAL_CAPSULE | Freq: Two times a day (BID) | ORAL | 11 refills | Status: DC

## 2018-08-06 MED ORDER — KETAMINE HCL 10 MG/ML IJ SOLN
INTRAMUSCULAR | Status: DC | PRN
  Administered 2018-08-06 (×2): 10 mg via INTRAVENOUS

## 2018-08-06 MED ORDER — LIDOCAINE HCL 1 % IJ SOLN
INTRAMUSCULAR | Status: DC | PRN
  Administered 2018-08-06: 30 mg via INTRADERMAL

## 2018-08-06 MED ORDER — PROMETHAZINE HCL 25 MG/ML IJ SOLN: 6.2500 mg | INTRAMUSCULAR | Status: DC | PRN

## 2018-08-06 NOTE — Telephone Encounter (Signed)
ENDO NURSE SAID [facility] WANTS PATIENT TO HAVE A CT SCAN

## 2018-08-06 NOTE — Transfer of Care (Signed)
Immediate Anesthesia Transfer of Care Note  Patient: Kimberly [name]  Procedure(s) Performed: COLONOSCOPY WITH PROPOFOL (N/A ) HEMORRHOID BANDING (N/A )  Patient Location: PACU  Anesthesia Type:General  Level of Consciousness: awake and alert   Airway & Oxygen Therapy: Patient Spontanous Breathing  Post-op Assessment: Report given to RN  Post vital signs: Reviewed and stable  Last Vitals:  Vitals Value Taken Time  BP 108/69 08/06/18 1130  Temp    Pulse 77 08/06/18 1130  Resp    SpO2 80 % 08/06/18 1130  Vitals shown include unvalidated device data.  Last Pain:  Vitals:   08/06/18 1055  TempSrc:   PainSc: 0-No pain         Complications: No apparent anesthesia complications

## 2018-08-06 NOTE — Discharge Instructions (Signed)
YOUR PREP WAS BETTER THIS TIME. YOUR RECTAL BLEEDING IS DUE TO MODERATE size internal AND YOU HAVE EXTERNAL hemorrhoids AS WELL. YOU DID NOT HAVE ANY POLYPS.   DRINK WATER TO KEEP YOUR URINE LIGHT YELLOW.  FOLLOW A HIGH FIBER DIET. AVOID ITEMS THAT CAUSE BLOATING. SEE INFO BELOW.   CONTINUE AMITIZA TWICE DAILY. IT TREATS CONSTIPATION BUT MAY CAUSE NAUSEA. USE PREPARATION H OR ANUSOL FOUR TIMES  A DAY WHEN NEEDED TO RELIEVE RECTAL PAIN/PRESSURE/BLEEDING. YOU CAN SEE SURGERY TO FIX YOUR HEMORRHOIDS IF YOUR SYMPTOMS CANNOT BE CONTROLLED WITH CREAMS OF SUPPOSITORIES.    COMPLETE CT SCAN OF THE ABDOMEN/PELVIS WITHIN THE NEXT 7-10 DAYS.  FOLLOW UP IN 6 MOS.   Next colonoscopy in 10 years.  Colonoscopy Care After Read the instructions outlined below and refer to this sheet in the next week. These discharge instructions provide you with general information on caring for yourself after you leave the hospital. While your treatment has been planned according to the most current medical practices available, unavoidable complications occasionally occur. If you have any problems or questions after discharge, call DR. Laticha Ferrucci, [phone].  ACTIVITY  You may resume your regular activity, but move at a slower pace for the next 24 hours.   Take frequent rest periods for the next 24 hours.   Walking will help get rid of the air and reduce the bloated feeling in your belly (abdomen).   No driving for 24 hours (because of the medicine (anesthesia) used during the test).   You may shower.   Do not sign any important legal documents or operate any machinery for 24 hours (because of the anesthesia used during the test).    NUTRITION  Drink plenty of fluids.   You may resume your normal diet as instructed by your doctor.   Begin with a light meal and progress to your normal diet. Heavy or fried foods are harder to digest and may make you feel sick to your stomach (nauseated).   Avoid alcoholic  beverages for 24 hours or as instructed.    MEDICATIONS  You may resume your normal medications.   WHAT YOU CAN EXPECT TODAY  Some feelings of bloating in the abdomen.   Passage of more gas than usual.   Spotting of blood in your stool or on the toilet paper  .  IF YOU HAD POLYPS REMOVED DURING THE COLONOSCOPY:  Eat a soft diet IF YOU HAVE NAUSEA, BLOATING, ABDOMINAL PAIN, OR VOMITING.    FINDING OUT THE RESULTS OF YOUR TEST Not all test results are available during your visit. DR. Darrick PennaFIELDS WILL CALL YOU WITHIN 14 DAYS OF YOUR PROCEDUE WITH YOUR RESULTS. Do not assume everything is normal if you have not heard from DR. Dionicio Shelnutt, CALL HER OFFICE AT [phone].  SEEK IMMEDIATE MEDICAL ATTENTION AND CALL THE OFFICE: [phone] IF:  You have more than a spotting of blood in your stool.   Your belly is swollen (abdominal distention).   You are nauseated or vomiting.   You have a temperature over 101F.   You have abdominal pain or discomfort that is severe or gets worse throughout the day.  High-Fiber Diet A high-fiber diet changes your normal diet to include more whole grains, legumes, fruits, and vegetables. Changes in the diet involve replacing refined carbohydrates with unrefined foods. The calorie level of the diet is essentially unchanged. The Dietary Reference Intake (recommended amount) for adult males is 38 grams per day. For adult females, it is 25 grams per  day. Pregnant and lactating women should consume 28 grams of fiber per day. Fiber is the intact part of a plant that is not broken down during digestion. Functional fiber is fiber that has been isolated from the plant to provide a beneficial effect in the body.  PURPOSE  Increase stool bulk.   Ease and regulate bowel movements.   Lower cholesterol.   REDUCE RISK OF COLON CANCER  INDICATIONS THAT YOU NEED MORE FIBER  Constipation and hemorrhoids.   Uncomplicated diverticulosis (intestine condition) and  irritable bowel syndrome.   Weight management.   As a protective measure against hardening of the arteries (atherosclerosis), diabetes, and cancer.   GUIDELINES FOR INCREASING FIBER IN THE DIET  Start adding fiber to the diet slowly. A gradual increase of about 5 more grams (2 slices of whole-wheat bread, 2 servings of most fruits or vegetables, or 1 bowl of high-fiber cereal) per day is best. Too rapid an increase in fiber may result in constipation, flatulence, and bloating.   Drink enough water and fluids to keep your urine clear or pale yellow. Water, juice, or caffeine-free drinks are recommended. Not drinking enough fluid may cause constipation.   Eat a variety of high-fiber foods rather than one type of fiber.   Try to increase your intake of fiber through using high-fiber foods rather than fiber pills or supplements that contain small amounts of fiber.   The goal is to change the types of food eaten. Do not supplement your present diet with high-fiber foods, but replace foods in your present diet.    INCLUDE A VARIETY OF FIBER SOURCES  Replace refined and processed grains with whole grains, canned fruits with fresh fruits, and incorporate other fiber sources. White rice, white breads, and most bakery goods contain little or no fiber.   Brown whole-grain rice, buckwheat oats, and many fruits and vegetables are all good sources of fiber. These include: broccoli, Brussels sprouts, cabbage, cauliflower, beets, sweet potatoes, white potatoes (skin on), carrots, tomatoes, eggplant, squash, berries, fresh fruits, and dried fruits.   Cereals appear to be the richest source of fiber. Cereal fiber is found in whole grains and bran. Bran is the fiber-rich outer coat of cereal grain, which is largely removed in refining. In whole-grain cereals, the bran remains. In breakfast cereals, the largest amount of fiber is found in those with "bran" in their names. The fiber content is sometimes indicated  on the label.   You may need to include additional fruits and vegetables each day.   In baking, for 1 cup white flour, you may use the following substitutions:   1 cup whole-wheat flour minus 2 tablespoons.   1/2 cup white flour plus 1/2 cup whole-wheat flour.   Hemorrhoids Hemorrhoids are dilated (enlarged) veins around the rectum. Sometimes clots will form in the veins. This makes them swollen and painful. These are called thrombosed hemorrhoids. Causes of hemorrhoids include:  Constipation.   Straining to have a bowel movement.   HEAVY LIFTING  HOME CARE INSTRUCTIONS  Eat a well balanced diet and drink 6 to 8 glasses of water every day to avoid constipation. You may also use a bulk laxative.   Avoid straining to have bowel movements.   Keep anal area dry and clean.   Do not use a donut shaped pillow or sit on the toilet for long periods. This increases blood pooling and pain.   Move your bowels when your body has the urge; this will require less straining and  will decrease pain and pressure.

## 2018-08-06 NOTE — Anesthesia Postprocedure Evaluation (Addendum)
Anesthesia Post Note  Patient: Kimberly [name]  Procedure(s) Performed: COLONOSCOPY WITH PROPOFOL (N/A ) HEMORRHOID BANDING (N/A )  Patient location during evaluation: PACU Anesthesia Type: General Level of consciousness: awake and alert and oriented Vital Signs Assessment: post-procedure vital signs reviewed and stable Respiratory status: spontaneous breathing Cardiovascular status: blood pressure returned to baseline and stable Postop Assessment: no apparent nausea or vomiting Anesthetic complications: no     Last Vitals:  Vitals:   08/06/18 1130 08/06/18 1145  BP:  118/76  Pulse:    Resp:  13  Temp: 36.5 C   SpO2:  100%    Last Pain:  Vitals:   08/06/18 1145  TempSrc:   PainSc: 0-No pain                 Drayden Lukas

## 2018-08-06 NOTE — Telephone Encounter (Signed)
CT SCAN OF THE ABDOMEN/PELVIS WITHIN THE NEXT 7-10 DAYS, DX: UNINTENTIONAL WEIGHT LOSS, ABDOMINAL PAIN.

## 2018-08-06 NOTE — Addendum Note (Signed)
Addendum  created 08/06/18 1402 by Ollen Bowl, CRNA   Clinical Note Signed, SmartForm saved

## 2018-08-06 NOTE — Telephone Encounter (Signed)
Patient aware of appt details. Nothing further needed 

## 2018-08-06 NOTE — Telephone Encounter (Signed)
Code entered and copies are going to scan center

## 2018-08-06 NOTE — Addendum Note (Signed)
Addended by: Inge Rise on: 08/06/2018 12:22 PM   Modules accepted: Orders

## 2018-08-06 NOTE — Telephone Encounter (Signed)
CT scheduled for 7/30 at 12:00pm, arrival time 11:45am, npo 4 hrs prior, p/u oral contrast prior to CT.  Will call patient later to advise of appt as she is still admitted for procedure   Called [employer] and spoke with [facility] customer service advocate. She advised no PA required for CT. Ref# [id number]

## 2018-08-06 NOTE — Anesthesia Preprocedure Evaluation (Addendum)
Anesthesia Evaluation  Patient identified by MRN, date of birth, ID band Patient awake    Reviewed: Allergy & Precautions, NPO status , Patient's Chart, lab work & pertinent test results  Airway Mallampati: III  TM Distance: >3 FB Neck ROM: Full  Mouth opening: Limited Mouth Opening  Dental no notable dental hx. (+) Teeth Intact   Pulmonary asthma , former smoker,  States as child -denies any recent inhaler use or issues    Pulmonary exam normal breath sounds clear to auscultation       Cardiovascular Exercise Tolerance: Good negative cardio ROS Normal cardiovascular examI Rhythm:Regular Rate:Normal     Neuro/Psych PSYCHIATRIC DISORDERS Depression negative neurological ROS     GI/Hepatic negative GI ROS, Neg liver ROS,   Endo/Other  negative endocrine ROS  Renal/GU negative Renal ROS  negative genitourinary   Musculoskeletal negative musculoskeletal ROS (+)   Abdominal   Peds negative pediatric ROS (+)  Hematology negative hematology ROS (+)   Anesthesia Other Findings   Reproductive/Obstetrics negative OB ROS                             Anesthesia Physical Anesthesia Plan  ASA: II  Anesthesia Plan: General   Post-op Pain Management:    Induction: Intravenous  PONV Risk Score and Plan: 2 and Treatment may vary due to age or medical condition, Propofol infusion and TIVA  Airway Management Planned: Simple Face Mask and Nasal Cannula  Additional Equipment:   Intra-op Plan:   Post-operative Plan:   Informed Consent: I have reviewed the patients History and Physical, chart, labs and discussed the procedure including the risks, benefits and alternatives for the proposed anesthesia with the patient or authorized representative who has indicated his/her understanding and acceptance.     Dental advisory given  Plan Discussed with: CRNA  Anesthesia Plan Comments: (Plan full  PPE use  Plan GA with GETA as needed -WTP after Q&A -anxious)       Anesthesia Quick Evaluation

## 2018-08-06 NOTE — Telephone Encounter (Signed)
See note [facility] has already sent to [place]. Thanks

## 2018-08-06 NOTE — Progress Notes (Signed)
CC'D TO PCP °

## 2018-08-06 NOTE — Op Note (Addendum)
[facility] Patient Name: Kimberly LoserDavenia Georgiou Procedure Date: 08/06/2018 10:14 AM MRN: [id number] Date of Birth: Mar 21, 1980 Attending MD: Jonette EvaSandi Ugonna Keirsey MD, MD CSN: [id number] Age: 38 Admit Type: Outpatient Procedure:                Colonoscopy, DIAGNOSTIC Indications:              Hematochezia Providers:                Jonette EvaSandi Dajahnae Vondra MD, MD, Loma MessingLurae B. Patsy LagerAlbert RN, RN, Edythe ClarityKelly                            Cox, Technician Referring MD:             Avon Gullyesfaye Fanta MD, MD Medicines:                Monitored Anesthesia Care Complications:            No immediate complications. Estimated Blood Loss:     Estimated blood loss: none. Procedure:                Pre-Anesthesia Assessment:                           - Prior to the procedure, a History and Physical                            was performed, and patient medications and                            allergies were reviewed. The patient's tolerance of                            previous anesthesia was also reviewed. The risks                            and benefits of the procedure and the sedation                            options and risks were discussed with the patient.                            All questions were answered, and informed consent                            was obtained. Prior Anticoagulants: The patient has                            taken no previous anticoagulant or antiplatelet                            agents. ASA Grade Assessment: I - A normal, healthy                            patient. After reviewing the risks and benefits,  the patient was deemed in satisfactory condition to                            undergo the procedure. After obtaining informed                            consent, the colonoscope was passed under direct                            vision. Throughout the procedure, the patient's                            blood pressure, pulse, and oxygen saturations were         monitored continuously. The [id number]-[id number] ([id number])                            scope was introduced through the anus and advanced                            to the 8 cm into the ileum. The colonoscopy was                            somewhat difficult due to a tortuous colon.                            Successful completion of the procedure was aided by                            straightening and shortening the scope to obtain                            bowel loop reduction and COLOWRAP. The patient                            tolerated the procedure well. The quality of the                            bowel preparation was good. The ileocecal valve,                            appendiceal orifice, and rectum were photographed. Scope In: 11:05:58 AM Scope Out: 11:18:46 AM Scope Withdrawal Time: 0 hours 9 minutes 50 seconds  Total Procedure Duration: 0 hours 12 minutes 48 seconds  Findings:      The terminal ileum appeared normal.      The recto-sigmoid colon, sigmoid colon and descending colon were       moderately tortuous.      External and internal hemorrhoids were found. The hemorrhoids were       moderate. Impression:               - The examined portion of the ileum was normal.                           - Tortuous  colon.                           - RECTAL BLEEDING DUE TO internal hemorrhoids.                           - NO SOURCE FOR ABDOMINAL PAIN IDENTIFIED AND IT IS                            MOST LIKELY DUE TO CONSTIPATION. Moderate Sedation:      Per Anesthesia Care Recommendation:           - Patient has a contact number available for                            emergencies. The signs and symptoms of potential                            delayed complications were discussed with the                            patient. Return to normal activities tomorrow.                            Written discharge instructions were provided to the                            patient.                            - High fiber diet.                           - Continue present medications. OBTAIN CT                            ABD/PELVIS TO ASSESS UNINTENTIONAL WEIGHT                            LOSS/ABDOMINAL PAIN.                           - Repeat colonoscopy in 10 years for surveillance.                           - Return to GI office in 6 months. Procedure Code(s):        --- Professional ---                           [phone], Colonoscopy, flexible; diagnostic, including                            collection of specimen(s) by brushing or washing,                            when performed (separate procedure) Diagnosis Code(s):        ---  Professional ---                           K64.8, Other hemorrhoids                           K92.1, Melena (includes Hematochezia)                           Q43.8, Other specified congenital malformations of                            intestine CPT copyright 2019 [employer]. All rights reserved. The codes documented in this report are preliminary and upon coder review may  be revised to meet current compliance requirements. Jonette EvaSandi Demiana Crumbley, MD Jonette EvaSandi Cherrelle Plante MD, MD 08/06/2018 11:27:37 AM This report has been signed electronically. Number of Addenda: 37

## 2018-08-06 NOTE — H&P (Signed)
Primary Care Physician:  Rosita Fire, MD Primary Gastroenterologist:  Dr. Oneida Alar  Pre-Procedure History & Physical: HPI:  Kimberly [name] is a 38 y.o. female here for  BRBPR.  Past Medical History:  Diagnosis Date  . Abnormal Pap smear   . Asthma   . BV (bacterial vaginosis) 05/03/2012  . Chlamydia   . Depression   . Gonorrhea 04/18/2016  . HPV (human papilloma virus) infection   . Irregular periods 03/06/2014  . Vaginal Pap smear, abnormal     Past Surgical History:  Procedure Laterality Date  . COLONOSCOPY N/A 02/20/2012   BZJ:[id number] sized internal hemorrhoids/otherwise normal  . LEEP    . None      Prior to Admission medications   Medication Sig Start Date End Date Taking? Authorizing Provider  hydrocortisone (ANUSOL-HC) 25 MG suppository Place 1 suppository (25 mg total) rectally every 12 (twelve) hours. Patient taking differently: Place 25 mg rectally 2 (two) times daily.  07/25/18   Erenest Rasher, PA-C    Allergies as of 07/30/2018  . (No Known Allergies)    Family History  Problem Relation Age of Onset  . Hypertension Paternal Grandmother   . Diabetes Maternal Grandmother   . Hypertension Maternal Grandmother   . Breast cancer Maternal Grandmother   . Cancer Paternal Grandfather   . Diabetes Maternal Grandfather   . Cancer Maternal Grandfather   . Hypertension Mother   . Diabetes Mother   . Diabetes Maternal Uncle   . Diabetes Maternal Uncle   . Colon cancer Neg Hx     Social History   Socioeconomic History  . Marital status: Single    Spouse name: Not on file  . Number of children: Not on file  . Years of education: Not on file  . Highest education level: Not on file  Occupational History  . Not on file  Social Needs  . Financial resource strain: Not on file  . Food insecurity    Worry: Not on file    Inability: Not on file  . Transportation needs    Medical: Not on file    Non-medical: Not on file  Tobacco Use  . Smoking status:  Former Smoker    Packs/day: 0.00    Years: 10.00    Pack years: 0.00    Types: Cigarettes    Start date: 04/2018  . Smokeless tobacco: Never Used  . Tobacco comment: stopped smoking 03/2018  Substance and Sexual Activity  . Alcohol use: Yes    Comment: rarely  . Drug use: Yes    Types: Marijuana    Comment: rarely. Last used 2 months ago.   Marland Kitchen Sexual activity: Yes    Birth control/protection: None  Lifestyle  . Physical activity    Days per week: Not on file    Minutes per session: Not on file  . Stress: Not on file  Relationships  . Social Herbalist on phone: Not on file    Gets together: Not on file    Attends religious service: Not on file    Active member of club or organization: Not on file    Attends meetings of clubs or organizations: Not on file    Relationship status: Not on file  . Intimate partner violence    Fear of current or ex partner: Not on file    Emotionally abused: Not on file    Physically abused: Not on file    Forced sexual activity: Not on  file  Other Topics Concern  . Not on file  Social History Narrative  . Not on file    Review of Systems: See HPI, otherwise negative ROS   Physical Exam: BP (!) 99/59   Pulse 65   Temp 98.3 F (36.8 C) (Oral)   Resp 18   LMP 07/25/2018 (Exact Date)   SpO2 98%  General:   Alert,  pleasant and cooperative in NAD Head:  Normocephalic and atraumatic. Neck:  Supple; Lungs:  Clear throughout to auscultation.    Heart:  Regular rate and rhythm. Abdomen:  Soft, nontender and nondistended. Normal bowel sounds, without guarding, and without rebound.   Neurologic:  Alert and  oriented x4;  grossly normal neurologically.  Impression/Plan:    BRBPR  PLAN: TCS/? HEMORRHOID BANDING TODAY. DISCUSSED PROCEDURE, BENEFITS, & RISKS: < 1% chance of medication reaction, bleeding, perforation, PELVIC VEIN SEPSIS, or rupture of spleen/liver.

## 2018-08-08 ENCOUNTER — Encounter: Payer: Self-pay | Admitting: *Deleted

## 2018-08-08 ENCOUNTER — Telehealth: Payer: Self-pay | Admitting: *Deleted

## 2018-08-08 NOTE — Telephone Encounter (Signed)
Pt is asking to see if we can modify her FMLA paperwork.  She has a CT scan tomorrow and pt says the paperwork won't cover the appointment.  Spoke with EG and we can provide pt with a work note.  Informed [place] per EG.   Faxed to [phone]

## 2018-08-08 NOTE — Telephone Encounter (Signed)
Agree with EGs recommendations.

## 2018-08-09 ENCOUNTER — Ambulatory Visit (HOSPITAL_COMMUNITY)
Admission: RE | Admit: 2018-08-09 | Discharge: 2018-08-09 | Disposition: A | Discharge status: Home / Self Care | Payer: BC Managed Care – PPO | Source: Ambulatory Visit | Attending: Gastroenterology | Admitting: Gastroenterology

## 2018-08-09 ENCOUNTER — Other Ambulatory Visit: Payer: Self-pay

## 2018-08-09 DIAGNOSIS — K648 Other hemorrhoids: Secondary | ICD-10-CM | POA: Diagnosis not present

## 2018-08-09 DIAGNOSIS — R634 Abnormal weight loss: Secondary | ICD-10-CM

## 2018-08-09 DIAGNOSIS — R1030 Lower abdominal pain, unspecified: Secondary | ICD-10-CM

## 2018-08-09 MED ORDER — IOHEXOL 300 MG/ML  SOLN
100.0000 mL | Freq: Once | INTRAMUSCULAR | Status: AC | PRN
  Administered 2018-08-09: 13:00:00 100 mL via INTRAVENOUS

## 2018-08-14 ENCOUNTER — Encounter (HOSPITAL_COMMUNITY): Payer: Self-pay | Admitting: Gastroenterology

## 2018-08-22 NOTE — Progress Notes (Signed)
PATIENT SCHEDULED AND ON RECALL  °

## 2018-08-23 NOTE — Progress Notes (Signed)
Pt is aware.  

## 2018-10-08 ENCOUNTER — Other Ambulatory Visit: Payer: Self-pay

## 2018-10-08 ENCOUNTER — Encounter: Payer: Self-pay | Admitting: Adult Health

## 2018-10-08 ENCOUNTER — Ambulatory Visit (INDEPENDENT_AMBULATORY_CARE_PROVIDER_SITE_OTHER): Payer: BC Managed Care – PPO | Admitting: Adult Health

## 2018-10-08 VITALS — BP 112/71 | HR 85 | Ht 67.0 in | Wt 143.8 lb

## 2018-10-08 DIAGNOSIS — Z319 Encounter for procreative management, unspecified: Secondary | ICD-10-CM | POA: Insufficient documentation

## 2018-10-08 NOTE — Progress Notes (Signed)
Patient ID: MARIECLAIRE BETTENHAUSEN, female   DOB: 1980/07/15, 38 y.o.   MRN: [id number] History of Present Illness: Kela is a 38 year old black female, G3P2, back in follow up on getting pregnant, is did ovulate in July, progesterone level was 17.2. She has had IUD out for about 5 years now and she told me today had PID once. PCP is Dr Legrand Rams.    Current Medications, Allergies, Past Medical History, Past Surgical History, Family History and Social History were reviewed in [employer] record.     Review of Systems: Periods regular now    Physical Exam:BP 112/71 (BP Location: Right Arm, Patient Position: Sitting, Cuff Size: Normal)   Pulse 85   Ht 5\' 7"  (1.702 m)   Wt 143 lb 12.8 oz (65.2 kg)   LMP 09/14/2018   BMI 22.52 kg/m  General:  Well developed, well nourished, no acute distress Skin:  Warm and dry Psych:  No mood changes, alert and cooperative,seems happy   Impression and Plan: 1. Patient desires pregnancy    She declines referral to [facility] at this time Will get an appt. With Dr Glo Herring to schedule HSG to see if tubes patent Follow up with Dr Glo Herring in about 4 weeks  Continue PNV and regular sex.

## 2018-10-29 ENCOUNTER — Ambulatory Visit: Payer: BC Managed Care – PPO | Admitting: Gastroenterology

## 2018-10-31 ENCOUNTER — Other Ambulatory Visit: Payer: Self-pay

## 2018-10-31 DIAGNOSIS — Z20822 Contact with and (suspected) exposure to covid-19: Secondary | ICD-10-CM

## 2018-11-02 LAB — NOVEL CORONAVIRUS, NAA: SARS-CoV-2, NAA: NOT DETECTED

## 2018-11-06 ENCOUNTER — Encounter: Payer: Self-pay | Admitting: Obstetrics and Gynecology

## 2018-11-06 ENCOUNTER — Ambulatory Visit (INDEPENDENT_AMBULATORY_CARE_PROVIDER_SITE_OTHER): Payer: BC Managed Care – PPO | Admitting: Obstetrics and Gynecology

## 2018-11-06 ENCOUNTER — Other Ambulatory Visit: Payer: Self-pay

## 2018-11-06 VITALS — BP 100/66 | HR 66 | Ht 67.0 in | Wt 141.5 lb

## 2018-11-06 DIAGNOSIS — N979 Female infertility, unspecified: Secondary | ICD-10-CM

## 2018-11-06 DIAGNOSIS — Z319 Encounter for procreative management, unspecified: Secondary | ICD-10-CM

## 2018-11-06 NOTE — Progress Notes (Signed)
[facility] Visit  @DATE @            [name] MRN [id number]  Date of birth: [birth year]  CC & HPI: discussion and chart review.  AARINI [name] is Kimberly 38 y.o. female presenting today for fertility concerns. She is Kimberly G3P2002 whose next menses is due soon. She has two children, both teenagers, (was pregnant in 2005), and has been with her current partner x several years, with negative GC and Chl cultures in 2019 x 2. She had an IUD for 5 years, then repeated it x 5 years, presumably Mirena. She reports Kimberly case of PID about the time of IUD removal. Record review shows + N. Gonorrhea in 2010, and 04/2016, with negative N Gonorrhea in 2017, 07/2016 (POC) and 2019 x 2. IUD was removed sometime in 2016-2017, not noted in Epic. Kimberly pelvic u/s 08/13/2015 showed Kimberly "moderate amount of free pelvic fluid" and does not mention IUD. She was dx'd at having PID in ED [facility] in 08/13/2015 at which time GC and CHL were negative.  She describes her periods as regular, but says they happen twice Kimberly month.  She had Kimberly serum progesterone 07/18/2018 of 17.2, suggesting ovulation. Last TSH was normal in 2014.   Current partner has no children, has not had fertility testing. I do not know if he was the partner at time of the + GC in 2018 or his STI hx.She was advised I 04/2018 to have partner tested by [facility] analysis, that hasn't happened.   ROS:  ROS   Pertinent History Reviewed:   Reviewed: Significant for  Medical         Past Medical History:  Diagnosis Date  . Abnormal Pap smear   . Asthma   . BV (bacterial vaginosis) 05/03/2012  . Chlamydia   . Depression   . Gonorrhea 04/18/2016  . HPV (human papilloma virus) infection   . Irregular periods 03/06/2014  . Vaginal Pap smear, abnormal                               Surgical Hx:    Past Surgical History:  Procedure Laterality Date  . COLONOSCOPY N/Kimberly 02/20/2012   WVP:XTGGYIRS sized internal hemorrhoids/otherwise normal  .  COLONOSCOPY WITH PROPOFOL N/Kimberly 08/06/2018   Procedure: COLONOSCOPY WITH PROPOFOL;  Surgeon: Danie Binder, MD;  [facility];  Service: Endoscopy;  Laterality: N/Kimberly;  11:00am  . HEMORRHOID BANDING N/Kimberly 08/06/2018   Procedure: HEMORRHOID BANDING;  Surgeon: Danie Binder, MD;  Location: [facility];  Service: Endoscopy;  Laterality: N/Kimberly;  . LEEP    . None     Medications: Reviewed & Updated - see associated section                       Current Outpatient Medications:  .  Prenatal Vit-Fe Fumarate-FA (MULTIVITAMIN-PRENATAL) 27-0.8 MG TABS tablet, Take 1 tablet by mouth daily at 12 noon., Disp: , Rfl:    Social History: Reviewed -  reports that she has quit smoking. Her smoking use included cigarettes. She started smoking about 6 months ago. She smoked 0.00 packs per day for 10.00 years. She has never used smokeless tobacco.  Objective Findings:  Vitals: Blood pressure 100/66, pulse 66, height 5\' 7"  (1.702 m), weight 141 lb 8 oz (64.2 kg), last menstrual period 10/07/2018, not currently breastfeeding.  PHYSICAL  EXAMINATION General appearance - alert, well appearing, and in no distress, oriented to person, place, and time and normal appearing weight Mental status - alert, oriented to person, place, and time, normal mood, behavior, speech, dress, motor activity, and thought processes   Assessment & Plan:   Kimberly:  1.  Secondary infertility x 3 yrs 2. Hx PID , hx GC x 2, Tubal factor considered likely  P:  1.  Given info on [facility] , partner needs testing 2. Will schedule for Kimberly repeat GC and CHL, and then consider Tubal Dye Studies after future Menses. 3. Will Check [facility] FSH estradiol cycle Day 3 as part of w/u. 4. Phone call to pt after this chart review. May offer Laparoscopy with Tubal dye studies over HSG.  The provider spent over 25 minutes with the visit , including previsit review, and documentation,with >than 50% spent in counseling and coordination of  care.

## 2018-12-01 ENCOUNTER — Other Ambulatory Visit: Payer: Self-pay

## 2018-12-01 ENCOUNTER — Emergency Department (HOSPITAL_COMMUNITY): Payer: BC Managed Care – PPO

## 2018-12-01 ENCOUNTER — Encounter (HOSPITAL_COMMUNITY): Payer: Self-pay | Admitting: Emergency Medicine

## 2018-12-01 ENCOUNTER — Emergency Department (HOSPITAL_COMMUNITY)
Admission: EM | Admit: 2018-12-01 | Discharge: 2018-12-01 | Disposition: A | Discharge status: Home / Self Care | Payer: BC Managed Care – PPO | Attending: Emergency Medicine | Admitting: Emergency Medicine

## 2018-12-01 DIAGNOSIS — Z87891 Personal history of nicotine dependence: Secondary | ICD-10-CM | POA: Diagnosis not present

## 2018-12-01 DIAGNOSIS — J45909 Unspecified asthma, uncomplicated: Secondary | ICD-10-CM | POA: Insufficient documentation

## 2018-12-01 DIAGNOSIS — R079 Chest pain, unspecified: Secondary | ICD-10-CM | POA: Insufficient documentation

## 2018-12-01 LAB — COMPREHENSIVE METABOLIC PANEL
ALT: 14 U/L (ref 0–44)
AST: 13 U/L — ABNORMAL LOW (ref 15–41)
Albumin: 3.6 g/dL (ref 3.5–5.0)
Alkaline Phosphatase: 45 U/L (ref 38–126)
Anion gap: 10 (ref 5–15)
BUN: 6 mg/dL (ref 6–20)
CO2: 25 mmol/L (ref 22–32)
Calcium: 8.9 mg/dL (ref 8.9–10.3)
Chloride: 104 mmol/L (ref 98–111)
Creatinine, Ser: 0.79 mg/dL (ref 0.44–1.00)
GFR calc Af Amer: >60 mL/min (ref >60)
GFR calc non Af Amer: >60 mL/min (ref >60)
Glucose, Bld: 95 mg/dL (ref 70–99)
Potassium: 3.6 mmol/L (ref 3.5–5.1)
Sodium: 139 mmol/L (ref 135–145)
Total Bilirubin: 0.6 mg/dL (ref 0.3–1.2)
Total Protein: 6.5 g/dL (ref 6.5–8.1)

## 2018-12-01 LAB — CBC WITH DIFFERENTIAL/PLATELET
Abs Immature Granulocytes: 0.01 10*3/uL (ref 0.00–0.07)
Basophils Absolute: 0 10*3/uL (ref 0.0–0.1)
Basophils Relative: 1 %
Eosinophils Absolute: 0.1 10*3/uL (ref 0.0–0.5)
Eosinophils Relative: 3 %
HCT: 36.9 % (ref 36.0–46.0)
Hemoglobin: 12.1 g/dL (ref 12.0–15.0)
Immature Granulocytes: 0 %
Lymphocytes Relative: 36 %
Lymphs Abs: 1.7 10*3/uL (ref 0.7–4.0)
MCH: 28.1 pg (ref 26.0–34.0)
MCHC: 32.8 g/dL (ref 30.0–36.0)
MCV: 85.6 fL (ref 80.0–100.0)
Monocytes Absolute: 0.5 10*3/uL (ref 0.1–1.0)
Monocytes Relative: 11 %
Neutro Abs: 2.4 10*3/uL (ref 1.7–7.7)
Neutrophils Relative %: 49 %
Platelets: 240 10*3/uL (ref 150–400)
RBC: 4.31 MIL/uL (ref 3.87–5.11)
RDW: 12.4 % (ref 11.5–15.5)
WBC: 4.8 10*3/uL (ref 4.0–10.5)
nRBC: 0 % (ref 0.0–0.2)

## 2018-12-01 LAB — LIPASE, BLOOD: Lipase: 20 U/L (ref 11–51)

## 2018-12-01 LAB — TROPONIN I (HIGH SENSITIVITY)
Troponin I (High Sensitivity): 3 ng/L (ref <18)
Troponin I (High Sensitivity): <2 ng/L (ref <18)

## 2018-12-01 NOTE — Discharge Instructions (Signed)
Please let your primary doctor on Monday to schedule close follow-up appointment for this coming week regarding the symptoms you are experiencing today.  If you develop chest pain, difficulty breathing, passing out or other new concerning symptom recommend return to ER for reassessment.

## 2018-12-01 NOTE — ED Triage Notes (Signed)
Pt c/o chest pain and SOB x 2 days. Pt states the pain is epigastric.

## 2018-12-01 NOTE — ED Provider Notes (Signed)
[facility] EMERGENCY DEPARTMENT Provider Note   CSN: [id number] Arrival date & time: 12/01/18  1948     History   Chief Complaint Chief Complaint  Patient presents with  . Chest Pain    HPI Kimberly [name] is a 38 y.o. female.  Presents to ER with chest pain.  Patient states over the past couple days she has been having intermittent episodes of chest discomfort, lower middle chest, burning sensation, last for few minutes then resolve spontaneously.  Not associated with exertion.  Not associated with eating.  Denies prior history of gastric reflux disease, pancreatitis, no prior history of coronary artery disease.  Former smoker.  Denies family history of coronary artery disease.  No prior history of DVT or PE.    HPI  Past Medical History:  Diagnosis Date  . Abnormal Pap smear   . Asthma   . BV (bacterial vaginosis) 05/03/2012  . Chlamydia   . Depression   . Gonorrhea 04/18/2016  . HPV (human papilloma virus) infection   . Irregular periods 03/06/2014  . Vaginal Pap smear, abnormal     Patient Active Problem List   Diagnosis Date Noted  . Patient desires pregnancy 10/08/2018  . Internal hemorrhoids 07/25/2018  . Gonorrhea 04/18/2016  . Screening for colorectal cancer 04/13/2016  . Bowel habit changes 04/13/2016  . History of rectal bleeding 04/13/2016  . Encounter for gynecological examination with Papanicolaou smear of cervix 04/13/2016  . Abdominal pain 03/24/2016  . Constipation 03/24/2016  . Irregular periods 03/06/2014  . BV (bacterial vaginosis) 05/03/2012  . Asthma 04/16/2012  . Abnormal pap 04/16/2012  . Abdominal bloating 02/18/2012  . Rectal bleeding 02/18/2012    Past Surgical History:  Procedure Laterality Date  . COLONOSCOPY N/A 02/20/2012   JXB:[id number]:Moderate sized internal hemorrhoids/otherwise normal  . COLONOSCOPY WITH PROPOFOL N/A 08/06/2018   Procedure: COLONOSCOPY WITH PROPOFOL;  Surgeon: West BaliFields, Sandi L, MD;  [facility];  Service:  Endoscopy;  Laterality: N/A;  11:00am  . HEMORRHOID BANDING N/A 08/06/2018   Procedure: HEMORRHOID BANDING;  Surgeon: West BaliFields, Sandi L, MD;  Location: [facility];  Service: Endoscopy;  Laterality: N/A;  . LEEP    . None       OB History    Gravida  3   Para  2   Term  2   Preterm      AB      Living  2     SAB      TAB      Ectopic      Multiple      Live Births  2            Home Medications    Prior to Admission medications   Medication Sig Start Date End Date Taking? Authorizing Provider  Prenatal Vit-Fe Fumarate-FA (PRENATAL MULTIVITAMIN) TABS tablet Take 1 tablet by mouth daily at 12 noon.   Yes [provider]    Family History Family History  Problem Relation Age of Onset  . Hypertension Paternal Grandmother   . Diabetes Maternal Grandmother   . Hypertension Maternal Grandmother   . Breast cancer Maternal Grandmother   . Cancer Paternal Grandfather   . Diabetes Maternal Grandfather   . Cancer Maternal Grandfather   . Hypertension Mother   . Diabetes Mother   . Diabetes Maternal Uncle   . Diabetes Maternal Uncle   . Colon cancer Neg Hx     Social History Social History   Tobacco Use  .  Smoking status: Former Smoker    Packs/day: 0.00    Years: 10.00    Pack years: 0.00    Types: Cigarettes    Start date: 04/2018  . Smokeless tobacco: Never Used  . Tobacco comment: stopped smoking 03/2018  Substance Use Topics  . Alcohol use: Not Currently    Comment: rarely  . Drug use: Not Currently    Types: Marijuana    Comment: rarely. Last used 2 months ago.      Allergies   Patient has no known allergies.   Review of Systems Review of Systems  Constitutional: Negative for chills and fever.  HENT: Negative for ear pain and sore throat.   Eyes: Negative for pain and visual disturbance.  Respiratory: Negative for cough and shortness of breath.   Cardiovascular: Positive for chest pain. Negative for palpitations.   Gastrointestinal: Negative for abdominal pain and vomiting.  Genitourinary: Negative for dysuria and hematuria.  Musculoskeletal: Negative for arthralgias and back pain.  Skin: Negative for color change and rash.  Neurological: Negative for seizures and syncope.  All other systems reviewed and are negative.    Physical Exam Updated Vital Signs BP 122/79   Pulse (!) 52   Temp 98.1 F (36.7 C)   Resp 19   Ht 5\' 7"  (1.702 m)   Wt 59 kg   SpO2 100%   BMI 20.36 kg/m   Physical Exam Vitals signs and nursing note reviewed.  Constitutional:      General: She is not in acute distress.    Appearance: She is well-developed.  HENT:     Head: Normocephalic and atraumatic.  Eyes:     Conjunctiva/sclera: Conjunctivae normal.  Neck:     Musculoskeletal: Neck supple.  Cardiovascular:     Rate and Rhythm: Normal rate and regular rhythm.     Heart sounds: No murmur.  Pulmonary:     Effort: Pulmonary effort is normal. No respiratory distress.     Breath sounds: Normal breath sounds.  Abdominal:     Palpations: Abdomen is soft.     Tenderness: There is no abdominal tenderness.  Musculoskeletal:     Right lower leg: No edema.     Left lower leg: No edema.  Skin:    General: Skin is warm and dry.     Capillary Refill: Capillary refill takes less than 2 seconds.  Neurological:     Mental Status: She is alert.      ED Treatments / Results  Labs (all labs ordered are listed, but only abnormal results are displayed) Labs Reviewed  COMPREHENSIVE METABOLIC PANEL - Abnormal; Notable for the following components:      Result Value   AST 13 (*)    All other components within normal limits  CBC WITH DIFFERENTIAL/PLATELET  LIPASE, BLOOD  TROPONIN I (HIGH SENSITIVITY)  TROPONIN I (HIGH SENSITIVITY)    EKG EKG Interpretation  Date/Time:  Saturday December 01 2018 20:03:18 EST Ventricular Rate:  55 PR Interval:    QRS Duration: 89 QT Interval:  425 QTC Calculation: 407 R  Axis:   65 Text Interpretation: Sinus rhythm Short PR interval Confirmed by [id number] (Marianna Fuss) on 12/01/2018 10:53:17 PM   Radiology Dg Chest 2 View  Result Date: 12/01/2018 CLINICAL DATA:  Chest pain EXAM: CHEST - 2 VIEW COMPARISON:  None. FINDINGS: The heart size and mediastinal contours are within normal limits. Both lungs are clear. The visualized skeletal structures are unremarkable. IMPRESSION: No active cardiopulmonary disease. Electronically Signed  By: Donavan Foil M.D.   On: 12/01/2018 20:22    Procedures Procedures (including critical care time)  Medications Ordered in ED Medications - No data to display   Initial Impression / Assessment and Plan / ED Course  I have reviewed the triage vital signs and the nursing notes.  Pertinent labs & imaging results that were available during my care of the patient were reviewed by me and considered in my medical decision making (see chart for details).  Clinical Course as of Dec 01 2343  Sat Dec 01, 2018  2310 Rechecked, updated on results, will dc after repeat trop is neg   [RD]    Clinical Course User Index [RD] Lucrezia Starch, MD      38 year old lady presents to ER with chest pain.  Here well-appearing, normal vital signs, EKG without ischemic changes, troponin x2 within normal limits, doubt ACS.  No hypoxia, no tachypnea, no shortness of breath associated with this.  Doubt pulmonary embolism.  Chest x-ray negative for acute pulmonary pathology.  Given work-up today and patient remains well-appearing with no ongoing symptoms on reassessment, believe she is appropriate for discharge and outpatient management this time.  Recommended recheck with her primary doctor.  Suspect more likely etiology reflux versus MSK strain.    After the discussed management above, the patient was determined to be safe for discharge.  The patient was in agreement with this plan and all questions regarding their care were answered.  ED  return precautions were discussed and the patient will return to the ED with any significant worsening of condition.   Final Clinical Impressions(s) / ED Diagnoses   Final diagnoses:  Chest pain, unspecified type    ED Discharge Orders    None       Lucrezia Starch, MD 12/01/18 [phone]

## 2018-12-10 ENCOUNTER — Telehealth: Payer: Self-pay | Admitting: *Deleted

## 2018-12-10 NOTE — Telephone Encounter (Signed)
Pt sees Dr. Glo Herring and wants a nurse to call back. Please call pt. Thanks!!

## 2018-12-10 NOTE — Telephone Encounter (Signed)
Pt states her period started 12/05/18. Has had 2 periods in November. The first period started 11/10/18. Pt is wanting to get pregnant. Pt states Dr. Glo Herring mentioned her having a procedure. Please advise. Thanks!! [place]

## 2018-12-11 ENCOUNTER — Telehealth: Payer: Self-pay | Admitting: Obstetrics and Gynecology

## 2018-12-11 NOTE — Telephone Encounter (Signed)
Left message on both home and cell for pt to call and make appt as mentioned in last note 11/06/2018. Pt needs a current GC and CHL testing, as well as AMH, estradiol,and FSH on cycle Day 3.

## 2018-12-11 NOTE — Telephone Encounter (Signed)
I have called Ms Hughson x 2 this afternoon, both home and cell, left messages.  Pt would need to agree to workup , including labs on cycle day 3 , and GC and CHL testing. Pt to call our office to discuss, or to set up appt.

## 2018-12-12 ENCOUNTER — Telehealth: Payer: Self-pay | Admitting: Obstetrics and Gynecology

## 2018-12-12 ENCOUNTER — Other Ambulatory Visit: Payer: Self-pay

## 2018-12-12 ENCOUNTER — Other Ambulatory Visit: Payer: BC Managed Care – PPO

## 2018-12-12 DIAGNOSIS — Z319 Encounter for procreative management, unspecified: Secondary | ICD-10-CM

## 2018-12-12 DIAGNOSIS — N979 Female infertility, unspecified: Secondary | ICD-10-CM

## 2018-12-12 NOTE — Telephone Encounter (Signed)
This patient called, stated that Dr. Glo Herring left her a message yesterday about having labs.  She is going to go to [facility] this afternoon.  Can you please make sure the order gets in.  I have added her to our schedule here.  Please see Ferg's note as to what she needs to have done.

## 2018-12-13 LAB — GC/CHLAMYDIA PROBE AMP
Chlamydia trachomatis, NAA: NEGATIVE
Neisseria Gonorrhoeae by PCR: NEGATIVE

## 2018-12-17 LAB — ESTRADIOL: Estradiol: 49 pg/mL

## 2018-12-17 LAB — FOLLICLE STIMULATING HORMONE: FSH: 7.4 m[IU]/mL

## 2018-12-17 LAB — ANTI MULLERIAN HORMONE: ANTI-MULLERIAN HORMONE (AMH): 0.943 ng/mL

## 2018-12-19 NOTE — Telephone Encounter (Signed)
Left message @ 10:33 am. JSY

## 2018-12-19 NOTE — Progress Notes (Deleted)
Referring Provider: Rosita Fire, MD Primary Care Physician:  Rosita Fire, MD Primary GI Physician: Dr. Oneida Alar  No chief complaint on file.   HPI:   Kimberly [name] is a 38 y.o. female presenting today for follow-up rectal bleeding, hemorrhoids, and constipation.  She was last seen in our office on 07/25/2018 for the same.  She completed IFOBT on 7/8 prior to office visit that was positive.  At her office visit, she reported rectal bleeding about 2 days a week with hemorrhoid flares with associated pressure in her rectum.  Constipation has improved with adapting healthier lifestyle.  She did occasionally have straining with small stools and associated lower abdominal "tightening" about 2-3 times a month that relieved after a BM.  Patient was scheduled for colonoscopy with possible hemorrhoid banding with Dr. Oneida Alar.  Prescription for Anusol suppositories twice daily up to 10 days. Advised to add daily fiber supplement to help with bowel regularity.    Colonoscopy on 08/06/2018: Examined portion of the ileum normal, tortuous colon, moderate external and internal hemorrhoids.  Rectal bleeding due to internal hemorrhoids.  No source for abdominal pain identified and is most likely due to constipation. Recommendations for high-fiber diet, CT abdomen and pelvis to assess unintentional weight loss and abdominal pain, repeat colonoscopy in 10 years for surveillance.  CT abdomen and pelvis on 08/09/2018 normal.  Suspected abdominal pain likely secondary to constipation.  She is advised to continue high-fiber diet, avoid items that cause bloating, continue Amitiza twice daily, Preparation H or Anusol 4 times a day when needed to relieve rectal pain/pressure/bleeding.  Advised to see surgery to fix hemorrhoids if symptoms cannot be controlled with creams or suppositories.   Today she states   Past Medical History:  Diagnosis Date  . Abnormal Pap smear   . Asthma   . BV (bacterial vaginosis)  05/03/2012  . Chlamydia   . Depression   . Gonorrhea 04/18/2016  . HPV (human papilloma virus) infection   . Irregular periods 03/06/2014  . Vaginal Pap smear, abnormal     Past Surgical History:  Procedure Laterality Date  . COLONOSCOPY N/A 02/20/2012   TMA:[id number] sized internal hemorrhoids/otherwise normal  . COLONOSCOPY WITH PROPOFOL N/A 08/06/2018   Procedure: COLONOSCOPY WITH PROPOFOL;  Surgeon: Danie Binder, MD;  [facility]: [facility];  Service: Endoscopy;  Laterality: N/A;  11:00am  . HEMORRHOID BANDING N/A 08/06/2018   Procedure: HEMORRHOID BANDING;  Surgeon: Danie Binder, MD;  Location: [facility];  Service: Endoscopy;  Laterality: N/A;  . LEEP    . None      Current Outpatient Medications  Medication Sig Dispense Refill  . Prenatal Vit-Fe Fumarate-FA (PRENATAL MULTIVITAMIN) TABS tablet Take 1 tablet by mouth daily at 12 noon.     No current facility-administered medications for this visit.     Allergies as of 12/20/2018  . (No Known Allergies)    Family History  Problem Relation Age of Onset  . Hypertension Paternal Grandmother   . Diabetes Maternal Grandmother   . Hypertension Maternal Grandmother   . Breast cancer Maternal Grandmother   . Cancer Paternal Grandfather   . Diabetes Maternal Grandfather   . Cancer Maternal Grandfather   . Hypertension Mother   . Diabetes Mother   . Diabetes Maternal Uncle   . Diabetes Maternal Uncle   . Colon cancer Neg Hx     Social History   Socioeconomic History  . Marital status: Single    Spouse name: Not  on file  . Number of children: Not on file  . Years of education: Not on file  . Highest education level: Not on file  Occupational History  . Not on file  Social Needs  . Financial resource strain: Not on file  . Food insecurity    Worry: Not on file    Inability: Not on file  . Transportation needs    Medical: Not on file    Non-medical: Not on file  Tobacco Use  . Smoking status: Former Smoker     Packs/day: 0.00    Years: 10.00    Pack years: 0.00    Types: Cigarettes    Start date: 04/2018  . Smokeless tobacco: Never Used  . Tobacco comment: stopped smoking 03/2018  Substance and Sexual Activity  . Alcohol use: Not Currently    Comment: rarely  . Drug use: Not Currently    Types: Marijuana    Comment: rarely. Last used 2 months ago.   Marland Kitchen Sexual activity: Yes    Birth control/protection: None  Lifestyle  . Physical activity    Days per week: Not on file    Minutes per session: Not on file  . Stress: Not on file  Relationships  . Social Musician on phone: Not on file    Gets together: Not on file    Attends religious service: Not on file    Active member of club or organization: Not on file    Attends meetings of clubs or organizations: Not on file    Relationship status: Not on file  Other Topics Concern  . Not on file  Social History Narrative  . Not on file    Review of Systems: Gen: Denies fever, chills, anorexia. Denies fatigue, weakness, weight loss.  CV: Denies chest pain, palpitations, syncope, peripheral edema, and claudication. Resp: Denies dyspnea at rest, cough, wheezing, coughing up blood, and pleurisy. GI: Denies vomiting blood, jaundice, and fecal incontinence.   Denies dysphagia or odynophagia. Derm: Denies rash, itching, dry skin Psych: Denies depression, anxiety, memory loss, confusion. No homicidal or suicidal ideation.  Heme: Denies bruising, bleeding, and enlarged lymph nodes.  Physical Exam: There were no vitals taken for this visit. General:   Alert and oriented. No distress noted. Pleasant and cooperative.  Head:  Normocephalic and atraumatic. Eyes:  Conjuctiva clear without scleral icterus. Mouth:  Oral mucosa pink and moist. Good dentition. No lesions. Heart:  S1, S2 present without murmurs appreciated. Lungs:  Clear to auscultation bilaterally. No wheezes, rales, or rhonchi. No distress.  Abdomen:  +BS, soft, non-tender  and non-distended. No rebound or guarding. No HSM or masses noted. Msk:  Symmetrical without gross deformities. Normal posture. Extremities:  Without edema. Neurologic:  Alert and  oriented x4 Psych:  Alert and cooperative. Normal mood and affect.

## 2018-12-20 ENCOUNTER — Telehealth: Payer: Self-pay | Admitting: *Deleted

## 2018-12-20 ENCOUNTER — Ambulatory Visit: Payer: BC Managed Care – PPO | Admitting: Gastroenterology

## 2018-12-20 NOTE — Telephone Encounter (Signed)
Left message @ 4:30 pm. JSY

## 2018-12-20 NOTE — Telephone Encounter (Signed)
Please call. Thanks.

## 2018-12-24 NOTE — Telephone Encounter (Signed)
Spoke with pt. Encounter closed. JSY °

## 2018-12-24 NOTE — Telephone Encounter (Signed)
Pt has had labs done and call was transferred to front desk for appt with Dr. Glo Herring. [place]

## 2018-12-28 ENCOUNTER — Ambulatory Visit: Payer: BC Managed Care – PPO | Admitting: Gastroenterology

## 2019-01-01 ENCOUNTER — Ambulatory Visit: Payer: BC Managed Care – PPO | Admitting: Obstetrics and Gynecology

## 2019-01-01 ENCOUNTER — Encounter: Payer: Self-pay | Admitting: Gastroenterology

## 2019-01-02 ENCOUNTER — Other Ambulatory Visit: Payer: Self-pay

## 2019-01-02 ENCOUNTER — Encounter: Payer: Self-pay | Admitting: Gastroenterology

## 2019-01-02 ENCOUNTER — Ambulatory Visit: Payer: BC Managed Care – PPO | Admitting: Obstetrics and Gynecology

## 2019-01-02 ENCOUNTER — Ambulatory Visit (INDEPENDENT_AMBULATORY_CARE_PROVIDER_SITE_OTHER): Payer: BC Managed Care – PPO | Admitting: Gastroenterology

## 2019-01-02 ENCOUNTER — Telehealth: Payer: BC Managed Care – PPO | Admitting: Obstetrics and Gynecology

## 2019-01-02 DIAGNOSIS — Z8719 Personal history of other diseases of the digestive system: Secondary | ICD-10-CM

## 2019-01-02 NOTE — Progress Notes (Signed)
Patient worked up for office visit by Zara Council, LPN. I sent two links for [email or web address] at 1:00 and 1:25pm. Patient was called multiple times and voice mail left regarding [place] waiting for her to complete visit. No contact from patient by 1:47 for her 1:30 appointment.   Patient no showed.

## 2019-01-09 ENCOUNTER — Telehealth: Payer: Self-pay | Admitting: Gastroenterology

## 2019-01-09 NOTE — Telephone Encounter (Signed)
Spoke with pt. She has a hx of constipation and gets some abdominal pain. Pt left work due to the abdominal pain and her employer wants pt to bring a note in about her condition. Pt reported no nausea or vomiting.

## 2019-01-09 NOTE — Telephone Encounter (Signed)
[phone]  Patient got sick at work and needs a note explaining her condition and why it can cause her to get sick

## 2019-01-09 NOTE — Telephone Encounter (Signed)
I have never personally seen this patient. She never connected to complete her virtual visit with me this month. See note.  Will route to [facility] who saw her last in the office.

## 2019-01-10 ENCOUNTER — Other Ambulatory Visit: Payer: Self-pay | Admitting: Gastroenterology

## 2019-01-10 DIAGNOSIS — K5900 Constipation, unspecified: Secondary | ICD-10-CM

## 2019-01-10 MED ORDER — LINACLOTIDE 145 MCG PO CAPS: 145.0000 ug | ORAL_CAPSULE | Freq: Every day | ORAL | 3 refills | Status: DC

## 2019-01-10 NOTE — Telephone Encounter (Signed)
Spoke with pt and she would like Linzess sent to her pharmacy.

## 2019-01-10 NOTE — Telephone Encounter (Signed)
Noted. If she feels Linzess 145 mcg worked well for her and would like a prescription, I can send one in to her pharmacy.

## 2019-01-10 NOTE — Telephone Encounter (Signed)
Rx sent 

## 2019-01-10 NOTE — Telephone Encounter (Signed)
Would be ok with providing a note for this occurrence. This is not something I think we should provide ongoing notes for in the future. She does have history of intermittent constipation. At last visit, she stated she had made dietary changes and rarely had constipation. She was advised to add a daily fiber supplement. Not sure if she has done this. She had a colonoscopy and CT A/P with no findings to explain her pain. Lower abdominal pain likely secondary to constipation. She should start using MiraLAX 1 capful (17g) daily. She may decrease frequency if stools become too frequent/loose, but she is likely going to need to take something fairly regularly to prevent recurrence of constipation. Also needs to drink plenty of water, keeping her urine pale yellow to clear.

## 2019-01-10 NOTE — Telephone Encounter (Signed)
REVIEWED. AGREE. NO ADDITIONAL RECOMMENDATIONS. 

## 2019-01-10 NOTE — Telephone Encounter (Signed)
Spoke with pt. Letter is ready for pickup. Pt states she didn't miss work and her supervisor wanted a note on file that she has a condition that causes abdominal pain. Discussed recommendations and pt states that she has an appointment in our office next week. Pt will discuss constipation further with Alger at her apt. Pt states she was given a trial of Linzess at her last apt and pt wasn't sure if she should get an RX. Pt's bowel movements were normal while taking the samples of Linzess. Pt didn't have diarrhea while taking Linzess.

## 2019-01-14 NOTE — Telephone Encounter (Signed)
Called pt and made her aware that her RX had been sent to pharmacy.  Pt voiced understanding.

## 2019-01-16 ENCOUNTER — Encounter: Payer: Self-pay | Admitting: Gastroenterology

## 2019-01-16 ENCOUNTER — Telehealth: Payer: Self-pay | Admitting: *Deleted

## 2019-01-16 ENCOUNTER — Other Ambulatory Visit: Payer: Self-pay

## 2019-01-16 ENCOUNTER — Telehealth: Payer: Self-pay | Admitting: Gastroenterology

## 2019-01-16 ENCOUNTER — Ambulatory Visit: Payer: BC Managed Care – PPO | Admitting: Gastroenterology

## 2019-01-16 NOTE — Telephone Encounter (Signed)
Called patient for virtual visit with LSL at 10:50am, LMTCB. No return call.

## 2019-01-16 NOTE — Telephone Encounter (Signed)
PATIENT WAS A NO SHOW AND LETTER SENT  °

## 2019-03-04 ENCOUNTER — Other Ambulatory Visit: Payer: Self-pay

## 2019-03-04 ENCOUNTER — Ambulatory Visit: Payer: BC Managed Care – PPO | Attending: Internal Medicine

## 2019-03-04 DIAGNOSIS — Z20822 Contact with and (suspected) exposure to covid-19: Secondary | ICD-10-CM

## 2019-03-05 LAB — NOVEL CORONAVIRUS, NAA: SARS-CoV-2, NAA: NOT DETECTED

## 2019-03-06 ENCOUNTER — Telehealth: Payer: Self-pay | Admitting: Internal Medicine

## 2019-03-06 NOTE — Telephone Encounter (Signed)
Patient called in and received her negative covid test result  

## 2019-05-16 ENCOUNTER — Telehealth: Payer: Self-pay | Admitting: Obstetrics and Gynecology

## 2019-05-16 ENCOUNTER — Telehealth: Payer: Self-pay | Admitting: *Deleted

## 2019-05-16 NOTE — Progress Notes (Signed)
Patient ID: Kimberly [name], female   DOB: 1980/12/05, 39 y.o.   MRN: [id number]    TELEHEALTH VIRTUAL GYNECOLOGY VISIT ENCOUNTER NOTE  I connected with Kimberly [name] on @TODAY @ at 10:30 AM EDT by telephone at home and verified that I am speaking with the correct person using two identifiers.   I discussed the limitations, risks, security and privacy concerns of performing an evaluation and management service by telephone and the availability of in person appointments. I also discussed with the patient that there may be a patient responsible charge related to this service. The patient expressed understanding and agreed to proceed.   History:  Kimberly [name] is a 39 y.o. [id number] female being evaluated for further discussion of infertility. She denies any abnormal vaginal discharge, bleeding, pelvic pain or other concerns.       Past Medical History:  Diagnosis Date  . Abnormal Pap smear   . Asthma   . BV (bacterial vaginosis) 05/03/2012  . Chlamydia   . Depression   . Gonorrhea 04/18/2016  . HPV (human papilloma virus) infection   . Irregular periods 03/06/2014  . Vaginal Pap smear, abnormal    Past Surgical History:  Procedure Laterality Date  . COLONOSCOPY N/A 02/20/2012   04/19/2012 sized internal hemorrhoids/otherwise normal  . COLONOSCOPY WITH PROPOFOL N/A 08/06/2018   Fields: hemorrhois, tortuous colon. next colonoscopy in 10 years  . HEMORRHOID BANDING N/A 08/06/2018   Procedure: HEMORRHOID BANDING;  Surgeon: 08/08/2018, MD;  Location: [facility];  Service: Endoscopy;  Laterality: N/A;  . LEEP    . None     The following portions of the patient's history were reviewed and updated as appropriate: allergies, current medications, past family history, past medical history, past social history, past surgical history and problem list.   Health Maintenance:  Last PAP smear was on 04/13/2016 and was negative for HRHPV but positive for neisseria gonorrhea.  Review of  Systems:  Pertinent items noted in HPI and remainder of comprehensive ROS otherwise negative.  Physical Exam:   General:  Alert, oriented and cooperative.   Mental Status: Normal mood and affect perceived. Normal judgment and thought content.  Physical exam deferred due to nature of the encounter  Labs and Imaging No results found for this or any previous visit (from the past 336 hour(s)). No results found.    Assessment and Plan:     @DIAGMED @      I discussed the assessment and treatment plan with the patient. The patient was provided an opportunity to ask questions and all were answered. The patient agreed with the plan and demonstrated an understanding of the instructions.   The patient was advised to call back or seek an in-person evaluation/go to the ED if the symptoms worsen or if the condition fails to improve as anticipated.  I provided 10 minutes of non-face-to-face time during this encounter.,  Plus an additional 10 minutes of reconnecting and documentation By signing my name below, I, 06/13/2016, attest that this documentation has been prepared under the direction and in the presence of , MD. Electronically Signed: Maleeha Nikki Dom. 05/16/19. 10:41 PM.  I personally performed the services described in this documentation, which was SCRIBED in my presence. The recorded information has been reviewed and considered accurate. It has been edited as necessary during review. [employer], MD

## 2019-05-16 NOTE — Telephone Encounter (Signed)
Patient called stating that she would a call back from Dr. Emelda Fear. Pt states that before she makes an appointment she would like to discuss it and them make the appointment. Please contact pt

## 2019-05-16 NOTE — Progress Notes (Signed)
PATIENT ID: Kimberly [name], female     DOB: 09/24/1980, 39 y.o.     MRN: [id number]    [facility] Visit  05/16/19           [name] MRN [id number]  Date of birth: 05-04-1980  CC & HPI:  Kimberly [name] is a 39 y.o. female presenting today for telephone visit regarding her fertility concerns.  She is a gravida 2 para 2 2, having had children that are now aged 11 and age 30.  She reports one episode of "PID", the dates of which runs clear to me.  Today she is calling for video visit.  Reception quality is very poor.  We lost reception at least 3 times before giving up The patient had expressed an interest in having fertility testing.  LMP is beginning today.  Previous menstrual period was April 10 and was 8 days late and was considered a normal full.  Today's.  Is occurring on cycle day 27.  There is been no fertility evaluation to date.  ROS:  Review of Systems  Constitutional: Negative for diaphoresis, fever, malaise/fatigue and weight loss.  HENT: Negative for congestion and sore throat.   Eyes: Negative for blurred vision and double vision.  Respiratory: Negative for cough and shortness of breath.   Cardiovascular: Negative for chest pain, palpitations and leg swelling.  Gastrointestinal: Negative for constipation, diarrhea, nausea and vomiting.  Genitourinary: Negative for frequency and urgency.  Musculoskeletal: Negative for back pain, falls and myalgias.  Skin: Negative for rash.  Neurological: Negative for dizziness, weakness and headaches.  Psychiatric/Behavioral: Negative for depression. The patient is not nervous/anxious.     Pertinent History Reviewed:  Reviewed: Significant for no fertility work-up to date Medical         Past Medical History:  Diagnosis Date  . Abnormal Pap smear   . Asthma   . BV (bacterial vaginosis) 05/03/2012  . Chlamydia   . Depression   . Gonorrhea 04/18/2016  . HPV (human papilloma virus) infection   .  Irregular periods 03/06/2014  . Vaginal Pap smear, abnormal                               Surgical Hx:    Past Surgical History:  Procedure Laterality Date  . COLONOSCOPY N/A 02/20/2012   YQM:VHQIONGE sized internal hemorrhoids/otherwise normal  . COLONOSCOPY WITH PROPOFOL N/A 08/06/2018   Fields: hemorrhois, tortuous colon. next colonoscopy in 10 years  . HEMORRHOID BANDING N/A 08/06/2018   Procedure: HEMORRHOID BANDING;  Surgeon: West Bali, MD;  [facility]: [facility];  Service: Endoscopy;  Laterality: N/A;  . LEEP    . None     Medications: Reviewed & Updated - see associated section                       Current Outpatient Medications:  .  linaclotide (LINZESS) 145 MCG CAPS capsule, Take 1 capsule (145 mcg total) by mouth daily before breakfast., Disp: 30 capsule, Rfl: 3 .  Prenatal Vit-Fe Fumarate-FA (PRENATAL MULTIVITAMIN) TABS tablet, Take 1 tablet by mouth daily at 12 noon., Disp: , Rfl:    Social History: Reviewed -  reports that she has quit smoking. Her smoking use included cigarettes. She started smoking about 13 months ago. She smoked 0.00 packs per day for 10.00 years. She has never used smokeless tobacco.  Objective  Findings:  Vitals: There were no vitals taken for this visit.  PHYSICAL EXAMINATION General appearance - alert, well appearing, and in no distress and normal appearing weight Mental status - alert, oriented to person, place, and time Chest - clear to auscultation, no wheezes, rales or rhonchi, symmetric air entry Heart -  Abdomen -  Breasts -  Skin -   PELVIC video visit only  external genitalia -  Vulva -  Vagina -  Cervix -   Uterus -   Adnexa -  [place]et [place] -  Rectal -     Assessment & Plan:   A:  1. Secondary infertility times many years  P:  1. Will begin fertility evaluation with serum progesterone TSH and FSH to be drawn on 28 May, cycle day 21 from today's menses    By signing my name below, I, [employer], attest that this  documentation has been prepared under the direction and in the presence of Jonnie Kind, MD. Electronically Signed: Grant. 05/16/19. 11:46 PM.  I personally performed the services described in this documentation, which was SCRIBED in my presence. The recorded information has been reviewed and considered accurate. It has been edited as necessary during review. Jonnie Kind, MD

## 2019-05-16 NOTE — Telephone Encounter (Signed)
Patient states she is wanting a call back from Dr Emelda Fear to discuss what kind of appointment she needs to make to figure out her infertility.  Informed patient that his last phone note on 12/1 states she needed to make an appointment.  Pt agreeable to make a virtual visit appointment to discuss tomorrow.

## 2019-05-17 ENCOUNTER — Encounter: Payer: Self-pay | Admitting: Obstetrics and Gynecology

## 2019-05-17 ENCOUNTER — Telehealth (INDEPENDENT_AMBULATORY_CARE_PROVIDER_SITE_OTHER): Payer: BC Managed Care – PPO | Admitting: Obstetrics and Gynecology

## 2019-05-17 ENCOUNTER — Other Ambulatory Visit: Payer: Self-pay

## 2019-05-17 DIAGNOSIS — N979 Female infertility, unspecified: Secondary | ICD-10-CM

## 2019-09-06 ENCOUNTER — Ambulatory Visit
Admission: EM | Admit: 2019-09-06 | Discharge: 2019-09-06 | Disposition: A | Discharge status: Home / Self Care | Payer: BC Managed Care – PPO | Attending: Emergency Medicine | Admitting: Emergency Medicine

## 2019-09-06 ENCOUNTER — Other Ambulatory Visit: Payer: Self-pay

## 2019-09-06 ENCOUNTER — Encounter: Payer: Self-pay | Admitting: Emergency Medicine

## 2019-09-06 DIAGNOSIS — R200 Anesthesia of skin: Secondary | ICD-10-CM

## 2019-09-06 DIAGNOSIS — A084 Viral intestinal infection, unspecified: Secondary | ICD-10-CM | POA: Diagnosis not present

## 2019-09-06 LAB — POCT URINE PREGNANCY: Preg Test, Ur: NEGATIVE

## 2019-09-06 MED ORDER — ONDANSETRON 4 MG PO TBDP: 4.0000 mg | ORAL_TABLET | Freq: Three times a day (TID) | ORAL | 0 refills | Status: DC | PRN

## 2019-09-06 MED ORDER — GABAPENTIN 100 MG PO CAPS: 100.0000 mg | ORAL_CAPSULE | Freq: Three times a day (TID) | ORAL | 0 refills | Status: DC

## 2019-09-06 NOTE — Discharge Instructions (Addendum)
Zofran was prescribed for nausea Gabapentin for prescribed for numbness of left arm Take medication as prescribed and to completion Follow-up with PCP Return to to ED for worsening of symptoms

## 2019-09-06 NOTE — ED Triage Notes (Addendum)
Pt reports numbness from her LT elbow to LT wrist x 3days.  Denies any injury. Pt is able to move all extremities with no difficulty.  Pt reports she uses her hands in repetitive motions all day at work.  Pain in upper abd on and off for years, pt states the pain normally in bottom of stomach.

## 2019-09-06 NOTE — ED Provider Notes (Signed)
[facility] CARE CENTER   [id number] 09/06/19 Arrival Time: 1854   Chief Complaint  Patient presents with  . Arm Pain     SUBJECTIVE: History from: patient.  Kimberly [name] is a 39 y.o. female who presented to the urgent care for complaint of left arm numbness and tingling for the past 3 days..  Denies any precipitating event denies recent travel.  Localized numbness to her left arm.  Denies any aggravating factor..  Denies previous symptoms in the past..   Denies fever, chills, fatigue, sinus pain, rhinorrhea, sore throat, SOB, wheezing, chest pain, nausea, changes in bowel or bladder habits.  Patient is also complaining of nausea, vomiting and diarrhea for the past few days.  Report 1 emesis today with 2 diarrhea.  Has not tried any OTC medication.  Denies similar symptom or someone with the same symptom in the house.  ROS: As per HPI.  All other pertinent ROS negative.     Past Medical History:  Diagnosis Date  . Abnormal Pap smear   . Asthma   . BV (bacterial vaginosis) 05/03/2012  . Chlamydia   . Depression   . Gonorrhea 04/18/2016  . HPV (human papilloma virus) infection   . Irregular periods 03/06/2014  . Vaginal Pap smear, abnormal    Past Surgical History:  Procedure Laterality Date  . COLONOSCOPY N/A 02/20/2012   QJJ:[id number] sized internal hemorrhoids/otherwise normal  . COLONOSCOPY WITH PROPOFOL N/A 08/06/2018   Fields: hemorrhois, tortuous colon. next colonoscopy in 10 years  . HEMORRHOID BANDING N/A 08/06/2018   Procedure: HEMORRHOID BANDING;  Surgeon: West Bali, MD;  Location: [facility];  Service: Endoscopy;  Laterality: N/A;  . LEEP    . None     No Known Allergies No current facility-administered medications on file prior to encounter.   Current Outpatient Medications on File Prior to Encounter  Medication Sig Dispense Refill  . linaclotide (LINZESS) 145 MCG CAPS capsule Take 1 capsule (145 mcg total) by mouth daily before breakfast. (Patient not  taking: Reported on 05/17/2019) 30 capsule 3  . Prenatal Vit-Fe Fumarate-FA (PRENATAL MULTIVITAMIN) TABS tablet Take 1 tablet by mouth daily at 12 noon.     Social History   Socioeconomic History  . Marital status: Single    Spouse name: Not on file  . Number of children: Not on file  . Years of education: Not on file  . Highest education level: Not on file  Occupational History  . Not on file  Tobacco Use  . Smoking status: Former Smoker    Packs/day: 0.00    Years: 10.00    Pack years: 0.00    Types: Cigarettes    Start date: 04/2018  . Smokeless tobacco: Never Used  . Tobacco comment: stopped smoking 03/2018  Vaping Use  . Vaping Use: Never used  Substance and Sexual Activity  . Alcohol use: Not Currently    Comment: rarely  . Drug use: Not Currently    Types: Marijuana    Comment: rarely  . Sexual activity: Yes    Birth control/protection: None  Other Topics Concern  . Not on file  Social History Narrative  . Not on file   Social Determinants of Health   Financial Resource Strain:   . Difficulty of Paying Living Expenses: Not on file  Food Insecurity:   . Worried About Programme researcher, broadcasting/film/video in the Last Year: Not on file  . Ran Out of Food in the Last Year: Not on file  Transportation Needs:   . Freight forwarder (Medical): Not on file  . Lack of Transportation (Non-Medical): Not on file  Physical Activity:   . Days of Exercise per Week: Not on file  . Minutes of Exercise per Session: Not on file  Stress:   . Feeling of Stress : Not on file  Social Connections:   . Frequency of Communication with Friends and Family: Not on file  . Frequency of Social Gatherings with Friends and Family: Not on file  . Attends Religious Services: Not on file  . Active Member of Clubs or Organizations: Not on file  . Attends Banker Meetings: Not on file  . Marital Status: Not on file  Intimate Partner Violence:   . Fear of Current or Ex-Partner: Not on file    . Emotionally Abused: Not on file  . Physically Abused: Not on file  . Sexually Abused: Not on file   Family History  Problem Relation Age of Onset  . Hypertension Paternal Grandmother   . Diabetes Maternal Grandmother   . Hypertension Maternal Grandmother   . Breast cancer Maternal Grandmother   . Cancer Paternal Grandfather   . Diabetes Maternal Grandfather   . Cancer Maternal Grandfather   . Hypertension Mother   . Diabetes Mother   . Diabetes Maternal Uncle   . Diabetes Maternal Uncle   . Colon cancer Neg Hx     OBJECTIVE:  Vitals:   09/06/19 1953 09/06/19 1956  BP:  109/65  Pulse:  (!) 58  Resp:  19  Temp:  98.3 F (36.8 C)  TempSrc:  Oral  SpO2:  99%  Weight: 130 lb 1.1 oz (59 kg)   Height: 5\' 7"  (1.702 m)      Physical Exam Vitals and nursing note reviewed.  Constitutional:      General: She is not in acute distress.    Appearance: Normal appearance. She is normal weight. She is not ill-appearing, toxic-appearing or diaphoretic.  HENT:     Head: Normocephalic.  Cardiovascular:     Rate and Rhythm: Normal rate and regular rhythm.     Pulses: Normal pulses.     Heart sounds: Normal heart sounds. No murmur heard.  No friction rub. No gallop.   Pulmonary:     Effort: Pulmonary effort is normal. No respiratory distress.     Breath sounds: Normal breath sounds. No stridor. No wheezing, rhonchi or rales.  Chest:     Chest wall: No tenderness.  Abdominal:     General: Abdomen is flat. Bowel sounds are normal. There is no distension.     Palpations: There is no mass.     Tenderness: There is no abdominal tenderness. There is no right CVA tenderness, left CVA tenderness, guarding or rebound.     Hernia: No hernia is present.  Neurological:     General: No focal deficit present.     Mental Status: She is alert and oriented to person, place, and time.     GCS: GCS eye subscore is 4. GCS verbal subscore is 5. GCS motor subscore is 6.     Cranial Nerves:  Cranial nerves are intact.     Sensory: Sensory deficit present.     Motor: Motor function is intact.     Coordination: Coordination is intact.     Gait: Gait is intact.     Deep Tendon Reflexes:     Reflex Scores:      Patellar reflexes are 2+ on  the right side and 2+ on the left side.     LABS:  Results for orders placed or performed during the hospital encounter of 09/06/19 (from the past 24 hour(s))  POCT urine pregnancy     Status: None   Collection Time: 09/06/19  8:23 PM  Result Value Ref Range   Preg Test, Ur Negative Negative     ASSESSMENT & PLAN:  1. Arm numbness left   2. Viral gastroenteritis     Meds ordered this encounter  Medications  . ondansetron (ZOFRAN ODT) 4 MG disintegrating tablet    Sig: Take 1 tablet (4 mg total) by mouth every 8 (eight) hours as needed for nausea or vomiting.    Dispense:  20 tablet    Refill:  0  . gabapentin (NEURONTIN) 100 MG capsule    Sig: Take 1 capsule (100 mg total) by mouth 3 (three) times daily.    Dispense:  60 capsule    Refill:  0    Discharge Instructions Zofran was prescribed for nausea Gabapentin for prescribed for numbness of left arm Take medication as prescribed and to completion Follow-up with PCP Return to to ED for worsening of symptoms  Reviewed expectations re: course of current medical issues. Questions answered. Outlined signs and symptoms indicating need for more acute intervention. Patient verbalized understanding. After Visit Summary given.      Note: This document was prepared using Dragon voice recognition software and may include unintentional dictation errors.    Durward Parcel, FNP 09/06/19 2026

## 2019-10-09 ENCOUNTER — Ambulatory Visit: Payer: BC Managed Care – PPO | Admitting: Internal Medicine

## 2019-11-20 ENCOUNTER — Other Ambulatory Visit: Payer: Self-pay

## 2019-11-20 DIAGNOSIS — S161XXA Strain of muscle, fascia and tendon at neck level, initial encounter: Secondary | ICD-10-CM | POA: Insufficient documentation

## 2019-11-20 DIAGNOSIS — S0990XA Unspecified injury of head, initial encounter: Secondary | ICD-10-CM | POA: Diagnosis not present

## 2019-11-20 DIAGNOSIS — Z87891 Personal history of nicotine dependence: Secondary | ICD-10-CM | POA: Diagnosis not present

## 2019-11-20 DIAGNOSIS — J45909 Unspecified asthma, uncomplicated: Secondary | ICD-10-CM | POA: Insufficient documentation

## 2019-11-20 DIAGNOSIS — Y999 Unspecified external cause status: Secondary | ICD-10-CM | POA: Insufficient documentation

## 2019-11-20 DIAGNOSIS — S060X0A Concussion without loss of consciousness, initial encounter: Secondary | ICD-10-CM | POA: Diagnosis not present

## 2019-11-20 DIAGNOSIS — S199XXA Unspecified injury of neck, initial encounter: Secondary | ICD-10-CM | POA: Diagnosis not present

## 2019-11-20 DIAGNOSIS — S299XXA Unspecified injury of thorax, initial encounter: Secondary | ICD-10-CM | POA: Insufficient documentation

## 2019-11-20 DIAGNOSIS — Y9389 Activity, other specified: Secondary | ICD-10-CM | POA: Insufficient documentation

## 2019-11-20 DIAGNOSIS — W2211XA Striking against or struck by driver side automobile airbag, initial encounter: Secondary | ICD-10-CM | POA: Diagnosis not present

## 2019-11-20 DIAGNOSIS — M25562 Pain in left knee: Secondary | ICD-10-CM | POA: Diagnosis not present

## 2019-11-20 DIAGNOSIS — R0789 Other chest pain: Secondary | ICD-10-CM | POA: Diagnosis not present

## 2019-11-20 DIAGNOSIS — S060X9A Concussion with loss of consciousness of unspecified duration, initial encounter: Secondary | ICD-10-CM | POA: Diagnosis not present

## 2019-11-20 DIAGNOSIS — M25561 Pain in right knee: Secondary | ICD-10-CM | POA: Diagnosis not present

## 2019-11-20 DIAGNOSIS — R079 Chest pain, unspecified: Secondary | ICD-10-CM | POA: Diagnosis not present

## 2019-11-20 DIAGNOSIS — Y9289 Other specified places as the place of occurrence of the external cause: Secondary | ICD-10-CM | POA: Insufficient documentation
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
## 2019-11-21 ENCOUNTER — Emergency Department (HOSPITAL_COMMUNITY)
Admission: EM | Admit: 2019-11-21 | Discharge: 2019-11-21 | Disposition: A | Discharge status: Home / Self Care | Payer: BC Managed Care – PPO | Attending: Emergency Medicine | Admitting: Emergency Medicine

## 2019-11-21 ENCOUNTER — Emergency Department (HOSPITAL_COMMUNITY): Payer: BC Managed Care – PPO

## 2019-11-21 DIAGNOSIS — S0990XA Unspecified injury of head, initial encounter: Secondary | ICD-10-CM | POA: Diagnosis not present

## 2019-11-21 DIAGNOSIS — M25562 Pain in left knee: Secondary | ICD-10-CM | POA: Diagnosis not present

## 2019-11-21 DIAGNOSIS — S199XXA Unspecified injury of neck, initial encounter: Secondary | ICD-10-CM | POA: Diagnosis not present

## 2019-11-21 DIAGNOSIS — S060X9A Concussion with loss of consciousness of unspecified duration, initial encounter: Secondary | ICD-10-CM

## 2019-11-21 DIAGNOSIS — S161XXA Strain of muscle, fascia and tendon at neck level, initial encounter: Secondary | ICD-10-CM

## 2019-11-21 DIAGNOSIS — R079 Chest pain, unspecified: Secondary | ICD-10-CM | POA: Diagnosis not present

## 2019-11-21 DIAGNOSIS — S298XXA Other specified injuries of thorax, initial encounter: Secondary | ICD-10-CM

## 2019-11-21 DIAGNOSIS — M25561 Pain in right knee: Secondary | ICD-10-CM | POA: Diagnosis not present

## 2019-11-21 MED ORDER — ACETAMINOPHEN 325 MG PO TABS
650.0000 mg | ORAL_TABLET | Freq: Once | ORAL | Status: AC
  Administered 2019-11-21: 650 mg via ORAL
  Filled 2019-11-21: qty 2
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
## 2019-11-21 NOTE — ED Notes (Signed)
Pt walked from WR to room, unassisted.

## 2019-12-02 DIAGNOSIS — F329 Major depressive disorder, single episode, unspecified: Secondary | ICD-10-CM | POA: Diagnosis not present

## 2019-12-02 DIAGNOSIS — F419 Anxiety disorder, unspecified: Secondary | ICD-10-CM | POA: Diagnosis not present

## 2019-12-12 ENCOUNTER — Other Ambulatory Visit (INDEPENDENT_AMBULATORY_CARE_PROVIDER_SITE_OTHER): Payer: BC Managed Care – PPO

## 2019-12-12 ENCOUNTER — Other Ambulatory Visit: Payer: Self-pay

## 2019-12-12 ENCOUNTER — Other Ambulatory Visit (HOSPITAL_COMMUNITY)
Admission: RE | Admit: 2019-12-12 | Discharge: 2019-12-12 | Disposition: A | Discharge status: Home / Self Care | Payer: BC Managed Care – PPO | Source: Ambulatory Visit | Attending: Obstetrics & Gynecology | Admitting: Obstetrics & Gynecology

## 2019-12-12 DIAGNOSIS — N760 Acute vaginitis: Secondary | ICD-10-CM | POA: Diagnosis not present

## 2019-12-12 DIAGNOSIS — Z113 Encounter for screening for infections with a predominantly sexual mode of transmission: Secondary | ICD-10-CM | POA: Diagnosis not present

## 2019-12-12 DIAGNOSIS — B9689 Other specified bacterial agents as the cause of diseases classified elsewhere: Secondary | ICD-10-CM | POA: Diagnosis not present

## 2019-12-12 NOTE — Progress Notes (Signed)
   NURSE VISIT- VAGINITIS/STD/POC  SUBJECTIVE:  Kimberly [name] is a 39 y.o. [id number] GYN patientfemale here for a vaginal swab for vaginitis screening, STD screen.  She reports the following symptoms: discharge described as white for 1 week. Denies abnormal vaginal bleeding, significant pelvic pain, fever, or UTI symptoms.  OBJECTIVE:  There were no vitals taken for this visit.  Appears well, in no apparent distress  ASSESSMENT: Vaginal swab for vaginitis screening  PLAN: Self-collected vaginal probe for Gonorrhea, Chlamydia, Trichomonas, Bacterial Vaginosis, Yeast sent to lab Treatment: to be determined once results are received Follow-up as needed if symptoms persist/worsen, or new symptoms develop  Caleigh Rabelo A Francies Inch  12/12/2019 4:06 PM

## 2019-12-16 LAB — CERVICOVAGINAL ANCILLARY ONLY
Bacterial Vaginitis (gardnerella): POSITIVE — AB
Candida Glabrata: NEGATIVE
Candida Vaginitis: NEGATIVE
Chlamydia: NEGATIVE
Comment: NEGATIVE
Comment: NEGATIVE
Comment: NEGATIVE
Comment: NEGATIVE
Comment: NORMAL
Neisseria Gonorrhea: NEGATIVE

## 2019-12-19 ENCOUNTER — Other Ambulatory Visit: Payer: Self-pay | Admitting: Advanced Practice Midwife

## 2019-12-19 MED ORDER — METRONIDAZOLE 500 MG PO TABS: 500.0000 mg | ORAL_TABLET | Freq: Two times a day (BID) | ORAL | 0 refills | Status: DC

## 2019-12-19 NOTE — Progress Notes (Signed)
Flagyl for BV 

## 2019-12-20 ENCOUNTER — Telehealth: Payer: Self-pay | Admitting: Adult Health

## 2019-12-20 MED ORDER — METRONIDAZOLE 0.75 % VA GEL: 1.0000 | Freq: Every day | VAGINAL | 0 refills | Status: DC

## 2019-12-20 NOTE — Telephone Encounter (Signed)
Call could not be completed, will send in rx for Metrogel

## 2019-12-20 NOTE — Telephone Encounter (Signed)
We sent in Rx yesterday for Flagyl Pt calling to ask if there's something else we can order She's taken this before & had a lot of nausea  Please advise & notify pt - pt states she needs this ASAP   [facility]

## 2020-01-28 ENCOUNTER — Other Ambulatory Visit: Payer: Self-pay | Admitting: Adult Health

## 2020-06-28 ENCOUNTER — Emergency Department (HOSPITAL_COMMUNITY)
Admission: EM | Admit: 2020-06-28 | Discharge: 2020-06-28 | Disposition: A | Discharge status: Home / Self Care | Payer: Self-pay | Attending: Emergency Medicine | Admitting: Emergency Medicine

## 2020-06-28 ENCOUNTER — Encounter (HOSPITAL_COMMUNITY): Payer: Self-pay | Admitting: *Deleted

## 2020-06-28 ENCOUNTER — Other Ambulatory Visit: Payer: Self-pay

## 2020-06-28 DIAGNOSIS — G8929 Other chronic pain: Secondary | ICD-10-CM | POA: Insufficient documentation

## 2020-06-28 DIAGNOSIS — R1013 Epigastric pain: Secondary | ICD-10-CM | POA: Insufficient documentation

## 2020-06-28 DIAGNOSIS — Z87891 Personal history of nicotine dependence: Secondary | ICD-10-CM | POA: Insufficient documentation

## 2020-06-28 DIAGNOSIS — J45909 Unspecified asthma, uncomplicated: Secondary | ICD-10-CM | POA: Insufficient documentation

## 2020-06-28 DIAGNOSIS — E876 Hypokalemia: Secondary | ICD-10-CM | POA: Insufficient documentation

## 2020-06-28 DIAGNOSIS — K5909 Other constipation: Secondary | ICD-10-CM | POA: Insufficient documentation

## 2020-06-28 DIAGNOSIS — R109 Unspecified abdominal pain: Secondary | ICD-10-CM

## 2020-06-28 DIAGNOSIS — D72819 Decreased white blood cell count, unspecified: Secondary | ICD-10-CM | POA: Insufficient documentation

## 2020-06-28 LAB — CBC WITH DIFFERENTIAL/PLATELET
Abs Immature Granulocytes: 0.01 10*3/uL (ref 0.00–0.07)
Basophils Absolute: 0 10*3/uL (ref 0.0–0.1)
Basophils Relative: 1 %
Eosinophils Absolute: 0 10*3/uL (ref 0.0–0.5)
Eosinophils Relative: 1 %
HCT: 43 % (ref 36.0–46.0)
Hemoglobin: 14.8 g/dL (ref 12.0–15.0)
Immature Granulocytes: 0 %
Lymphocytes Relative: 43 %
Lymphs Abs: 1.5 10*3/uL (ref 0.7–4.0)
MCH: 28.6 pg (ref 26.0–34.0)
MCHC: 34.4 g/dL (ref 30.0–36.0)
MCV: 83 fL (ref 80.0–100.0)
Monocytes Absolute: 0.4 10*3/uL (ref 0.1–1.0)
Monocytes Relative: 12 %
Neutro Abs: 1.5 10*3/uL — ABNORMAL LOW (ref 1.7–7.7)
Neutrophils Relative %: 43 %
Platelets: 265 10*3/uL (ref 150–400)
RBC: 5.18 MIL/uL — ABNORMAL HIGH (ref 3.87–5.11)
RDW: 12.3 % (ref 11.5–15.5)
WBC: 3.5 10*3/uL — ABNORMAL LOW (ref 4.0–10.5)
nRBC: 0 % (ref 0.0–0.2)

## 2020-06-28 LAB — COMPREHENSIVE METABOLIC PANEL
ALT: 16 U/L (ref 0–44)
AST: 22 U/L (ref 15–41)
Albumin: 4.7 g/dL (ref 3.5–5.0)
Alkaline Phosphatase: 49 U/L (ref 38–126)
Anion gap: 10 (ref 5–15)
BUN: 12 mg/dL (ref 6–20)
CO2: 28 mmol/L (ref 22–32)
Calcium: 9.2 mg/dL (ref 8.9–10.3)
Chloride: 98 mmol/L (ref 98–111)
Creatinine, Ser: 0.86 mg/dL (ref 0.44–1.00)
GFR, Estimated: >60 mL/min (ref >60)
Glucose, Bld: 115 mg/dL — ABNORMAL HIGH (ref 70–99)
Potassium: 3.3 mmol/L — ABNORMAL LOW (ref 3.5–5.1)
Sodium: 136 mmol/L (ref 135–145)
Total Bilirubin: 0.7 mg/dL (ref 0.3–1.2)
Total Protein: 7.6 g/dL (ref 6.5–8.1)

## 2020-06-28 LAB — URINALYSIS, ROUTINE W REFLEX MICROSCOPIC
Bacteria, UA: NONE SEEN
Bilirubin Urine: NEGATIVE
Glucose, UA: NEGATIVE mg/dL
Ketones, ur: 20 mg/dL — AB
Leukocytes,Ua: NEGATIVE
Nitrite: NEGATIVE
Protein, ur: 30 mg/dL — AB
Specific Gravity, Urine: 1.032 — ABNORMAL HIGH (ref 1.005–1.030)
pH: 5 (ref 5.0–8.0)

## 2020-06-28 LAB — I-STAT BETA HCG BLOOD, ED (MC, WL, AP ONLY): I-stat hCG, quantitative: <5 m[IU]/mL (ref <5)

## 2020-06-28 LAB — LIPASE, BLOOD: Lipase: 35 U/L (ref 11–51)

## 2020-06-28 MED ORDER — LIDOCAINE VISCOUS HCL 2 % MT SOLN
15.0000 mL | Freq: Once | OROMUCOSAL | Status: AC
  Administered 2020-06-28: 15 mL via ORAL
  Filled 2020-06-28: qty 15

## 2020-06-28 MED ORDER — POTASSIUM CHLORIDE CRYS ER 20 MEQ PO TBCR
40.0000 meq | EXTENDED_RELEASE_TABLET | Freq: Once | ORAL | Status: AC
  Administered 2020-06-28: 40 meq via ORAL
  Filled 2020-06-28: qty 2

## 2020-06-28 MED ORDER — ALUM & MAG HYDROXIDE-SIMETH 200-200-20 MG/5ML PO SUSP
30.0000 mL | Freq: Once | ORAL | Status: AC
  Administered 2020-06-28: 30 mL via ORAL
  Filled 2020-06-28: qty 30

## 2020-06-28 MED ORDER — FAMOTIDINE IN NACL 20-0.9 MG/50ML-% IV SOLN
20.0000 mg | Freq: Once | INTRAVENOUS | Status: AC
  Administered 2020-06-28: 20 mg via INTRAVENOUS
  Filled 2020-06-28: qty 50

## 2020-06-28 MED ORDER — SODIUM CHLORIDE 0.9 % IV BOLUS
1000.0000 mL | Freq: Once | INTRAVENOUS | Status: AC
  Administered 2020-06-28: 1000 mL via INTRAVENOUS

## 2020-06-28 MED ORDER — FAMOTIDINE 20 MG PO TABS: 20.0000 mg | ORAL_TABLET | Freq: Two times a day (BID) | ORAL | 0 refills | Status: DC

## 2020-06-28 MED ORDER — SUCRALFATE 1 GM/10ML PO SUSP
1.0000 g | Freq: Once | ORAL | Status: AC
  Administered 2020-06-28: 1 g via ORAL
  Filled 2020-06-28: qty 10

## 2020-06-28 NOTE — ED Provider Notes (Signed)
Kimberly [name]   CSN: [id number] Arrival date & time: 06/28/20  1630     History Chief Complaint  Patient presents with   Abdominal Pain    Kimberly [name] is a 40 y.o. female.  HPI   Pt is a 40 y/o female with a h/o asthma, BV, chlamydia, depression, gonorrhea, HPV, who presents to the ED today c/o concern for dehydration. States that she has not been able to eat for the last week because of increased stress.  She cites multiple factors affecting her stress including relationship issues and changes in her job.  She denies any current suicidal or homicidal thoughts.  She further states that she has had decreased p.o. intake because of her chronic abdominal pain. States she has had abd pain to the epigastrium for years that is unchanged today.  She reports chronic constipation. Denies NVD. Denies dysuria, frequency, vaginal discharge. She is currently on her menstrual cycle. Denies concern for STD. Denies ETOH use, drug use. She does not use frequent NSAIDs.   Past Medical History:  Diagnosis Date   Abnormal Pap smear    Asthma    BV (bacterial vaginosis) 05/03/2012   Chlamydia    Depression    Gonorrhea 04/18/2016   HPV (human papilloma virus) infection    Irregular periods 03/06/2014   Vaginal Pap smear, abnormal     Patient Active Problem List   Diagnosis Date Noted   Patient desires pregnancy 10/08/2018   Internal hemorrhoids 07/25/2018   Gonorrhea 04/18/2016   Screening for colorectal cancer 04/13/2016   Bowel habit changes 04/13/2016   History of rectal bleeding 04/13/2016   Encounter for gynecological examination with Papanicolaou smear of cervix 04/13/2016   Abdominal pain 03/24/2016   Constipation 03/24/2016   Irregular periods 03/06/2014   BV (bacterial vaginosis) 05/03/2012   Asthma 04/16/2012   Abnormal pap 04/16/2012   Abdominal bloating 02/18/2012   Rectal bleeding 02/18/2012    Past Surgical History:  Procedure  Laterality Date   COLONOSCOPY N/A 02/20/2012   QTM:AUQJFHLK sized internal hemorrhoids/otherwise normal   COLONOSCOPY WITH PROPOFOL N/A 08/06/2018   Fields: hemorrhois, tortuous colon. next colonoscopy in 10 years   HEMORRHOID BANDING N/A 08/06/2018   Procedure: HEMORRHOID BANDING;  Surgeon: West Bali, MD;  Location: [facility];  Service: Endoscopy;  Laterality: N/A;   LEEP     None       OB History     Gravida  2   Para  2   Term  2   Preterm      AB      Living  2      SAB      IAB      Ectopic      Multiple      Live Births  2           Family History  Problem Relation Age of Onset   Hypertension Paternal Grandmother    Diabetes Maternal Grandmother    Hypertension Maternal Grandmother    Breast cancer Maternal Grandmother    Cancer Paternal Grandfather    Diabetes Maternal Grandfather    Cancer Maternal Grandfather    Hypertension Mother    Diabetes Mother    Diabetes Maternal Uncle    Diabetes Maternal Uncle    Colon cancer Neg Hx     Social History   Tobacco Use   Smoking status: Former    Packs/day: 0.00    Years: 10.00  Pack years: 0.00    Types: Cigarettes    Start date: 04/2018   Smokeless tobacco: Never   Tobacco comments:    stopped smoking 03/2018  Vaping Use   Vaping Use: Never used  Substance Use Topics   Alcohol use: Not Currently    Comment: rarely   Drug use: Not Currently    Types: Marijuana    Comment: rarely    Home Medications Prior to Admission medications   Medication Sig Start Date End Date Taking? Authorizing Provider  famotidine (PEPCID) 20 MG tablet Take 1 tablet (20 mg total) by mouth 2 (two) times daily. 06/28/20  Yes Rolfe Hartsell S, PA-C  gabapentin (NEURONTIN) 100 MG capsule Take 1 capsule (100 mg total) by mouth 3 (three) times daily. 09/06/19   Avegno, Zachery Dakins, FNP  linaclotide (LINZESS) 145 MCG CAPS capsule Take 1 capsule (145 mcg total) by mouth daily before breakfast. Patient not  taking: Reported on 05/17/2019 01/10/19   Letta Median, PA-C  metroNIDAZOLE (FLAGYL) 500 MG tablet Take 1 tablet (500 mg total) by mouth 2 (two) times daily. 12/19/19   Cresenzo-Dishmon, Scarlette Calico, CNM  metroNIDAZOLE (METROGEL VAGINAL) 0.75 % vaginal gel Place 1 Applicatorful vaginally at bedtime. 12/20/19   Adline Potter, NP  ondansetron (ZOFRAN ODT) 4 MG disintegrating tablet Take 1 tablet (4 mg total) by mouth every 8 (eight) hours as needed for nausea or vomiting. 09/06/19   Avegno, Zachery Dakins, FNP  Prenatal Vit-Fe Fumarate-FA (PRENATAL MULTIVITAMIN) TABS tablet Take 1 tablet by mouth daily at 12 noon.    [provider]    Allergies    Patient has no known allergies.  Review of Systems   Review of Systems  Constitutional:  Negative for fever.  HENT:  Negative for ear pain and sore throat.   Eyes:  Negative for visual disturbance.  Respiratory:  Negative for cough and shortness of breath.   Cardiovascular:  Negative for chest pain.  Gastrointestinal:  Positive for abdominal pain and constipation. Negative for diarrhea, nausea and vomiting.  Genitourinary:  Negative for dysuria and hematuria.  Musculoskeletal:  Negative for arthralgias and back pain.  Skin:  Negative for color change and rash.  Neurological:  Negative for seizures and syncope.  Psychiatric/Behavioral:  Positive for dysphoric mood.        Denies si or hi  All other systems reviewed and are negative.  Physical Exam Updated Vital Signs BP 117/80 (BP Location: Right Arm)   Pulse 64   Temp 98.7 F (37.1 C) (Oral)   Resp 17   SpO2 100%   Physical Exam Vitals and nursing [name] reviewed.  Constitutional:      General: She is not in acute distress.    Appearance: She is well-developed.  HENT:     Head: Normocephalic and atraumatic.     Mouth/Throat:     Mouth: Mucous membranes are dry.  Eyes:     Conjunctiva/sclera: Conjunctivae normal.  Cardiovascular:     Rate and Rhythm: Normal rate and  regular rhythm.     Heart sounds: Normal heart sounds. No murmur heard. Pulmonary:     Effort: Pulmonary effort is normal. No respiratory distress.     Breath sounds: Normal breath sounds. No wheezing, rhonchi or rales.  Abdominal:     General: Bowel sounds are normal.     Palpations: Abdomen is soft.     Tenderness: There is abdominal tenderness in the epigastric area. There is no guarding or rebound.  Musculoskeletal:  Cervical back: Neck supple.  Skin:    General: Skin is warm and dry.  Neurological:     Mental Status: She is alert.    ED Results / Procedures / Treatments   Labs (all labs ordered are listed, but only abnormal results are displayed) Labs Reviewed  CBC WITH DIFFERENTIAL/PLATELET  COMPREHENSIVE METABOLIC PANEL  LIPASE, BLOOD  URINALYSIS, ROUTINE W REFLEX MICROSCOPIC  I-STAT BETA HCG BLOOD, ED ([facility], [facility], [facility] ONLY)    EKG None  Radiology No results found.  Procedures Procedures   Medications Ordered in ED Medications  sodium chloride 0.9 % bolus 1,000 mL (has no administration in time range)  famotidine (PEPCID) IVPB 20 mg premix (has no administration in time range)  alum & mag hydroxide-simeth (MAALOX/MYLANTA) 200-200-20 MG/5ML suspension 30 mL (has no administration in time range)    And  lidocaine (XYLOCAINE) 2 % viscous mouth solution 15 mL (has no administration in time range)  sucralfate (CARAFATE) 1 GM/10ML suspension 1 g (has no administration in time range)    ED Course  I have reviewed the triage vital signs and the nursing notes.  Pertinent labs & imaging results that were available during my care of the patient were reviewed by me and considered in my medical decision making (see chart for details).    MDM Rules/Calculators/A&P                          40 year old female presents to the emergency department today for evaluation of decreased p.o. intake which she attributes to recent increased stress in her chronic abdominal pain.   She has chronic constipation.  Denies vomiting, diarrhea, urinary complaints  Labwork is reassuring. She has  mild leukopenia and mild hypokalemia which was supplemented in the ED. She has been able to tolerate po. This pain seems chronic in nature and I have low suspicion for emergency intraabdominal process such as discitis, diverticulitis, cholecystitis, PID, ectopic pregnancy or other emergent cause at this time.  Will give rx for symptomatic tx with pepcid at home. Have advised f/u with pcp and gi f/u. Advised on close f/u with strict return precautions. All questions answered, pt stable for discharge.   Final Clinical Impression(s) / ED Diagnoses Final diagnoses:  Abdominal pain, unspecified abdominal location    Rx / DC Orders ED Discharge Orders          Ordered    famotidine (PEPCID) 20 MG tablet  2 times daily        06/28/20 1819             Jacquie Lukes, [employer], PA-C 06/28/20 1906    Mancel Bale, MD 06/29/20 1156

## 2020-06-28 NOTE — ED Triage Notes (Signed)
Abdominal pain x 4 days, states she has not been able to eat

## 2020-06-28 NOTE — Discharge Instructions (Addendum)
Please follow up with your primary care provider within 5-7 days for re-evaluation of your symptoms. If you do not have a primary care provider, information for a healthcare clinic has been provided for you to make arrangements for follow up care. Please return to the emergency department for any new or worsening symptoms. ° °

## 2020-07-03 ENCOUNTER — Telehealth: Payer: Self-pay

## 2020-07-03 NOTE — Telephone Encounter (Signed)
Spoke with pt. Pt was seen in ER recently. Wonders if she is pregnant. Period has not been right this month. Pt was advised a pregnancy test was done in the ER and it was negative. Pt has been trying to get pregnant. I advised she can schedule an appt to talk to one of the providers. Pt wants to know how much it would cost; she doesn't have insurance. Enid Derry is going to look up that info and call pt back. JSY

## 2020-07-03 NOTE — Telephone Encounter (Signed)
Tc from patient requesting call back  in regards to lab results and some levels, phone got disconnected she wanted to leave a secondary number for call back

## 2021-02-25 ENCOUNTER — Ambulatory Visit: Payer: Self-pay | Admitting: Adult Health

## 2021-03-02 ENCOUNTER — Encounter: Payer: Self-pay | Admitting: Adult Health

## 2021-03-02 ENCOUNTER — Ambulatory Visit (INDEPENDENT_AMBULATORY_CARE_PROVIDER_SITE_OTHER): Payer: BC Managed Care – PPO | Admitting: Adult Health

## 2021-03-02 ENCOUNTER — Other Ambulatory Visit: Payer: Self-pay

## 2021-03-02 VITALS — BP 101/65 | HR 70 | Ht 66.0 in | Wt 159.0 lb

## 2021-03-02 DIAGNOSIS — Z319 Encounter for procreative management, unspecified: Secondary | ICD-10-CM | POA: Diagnosis not present

## 2021-03-02 DIAGNOSIS — Z3202 Encounter for pregnancy test, result negative: Secondary | ICD-10-CM | POA: Diagnosis not present

## 2021-03-02 DIAGNOSIS — N926 Irregular menstruation, unspecified: Secondary | ICD-10-CM | POA: Diagnosis not present

## 2021-03-02 LAB — POCT URINE PREGNANCY: Preg Test, Ur: NEGATIVE

## 2021-03-02 NOTE — Progress Notes (Signed)
°  Subjective:     Patient ID: Kimberly [name], female   DOB: 1980/03/15, 41 y.o.   MRN: [id number]  HPI Kimberly [name] is a 41 year old black female,single, G2P2002 in complaining of 2 periods in January, she was last seen in 2021 by Dr Emelda Fear and did not get labs done then. She has been with same partner and has not gotten pregnant since IUD removed years ago. He has not had Semen analysis, either. PCP is Dr Felecia Shelling Lab Results  Component Value Date   DIAGPAP  04/13/2016    NEGATIVE FOR INTRAEPITHELIAL LESIONS OR MALIGNANCY.   HPV NOT DETECTED 04/13/2016     Review of Systems Had 2 periods in January Wants to get pregnant Reviewed past medical,surgical, social and family history. Reviewed medications and allergies.     Objective:   Physical Exam BP 101/65 (BP Location: Left Arm, Patient Position: Sitting, Cuff Size: Normal)    Pulse 70    Ht 5\' 6"  (1.676 m)    Wt 159 lb (72.1 kg)    LMP 03/01/2021    BMI 25.66 kg/m     UPT is negative. Skin warm and dry. Lungs: clear to ausculation bilaterally. Cardiovascular: regular rate and rhythm.  She refused pelvic today.  Fall risk is low  [facility] - 03/02/21 1446       Pregnancy Intention Screening   Does the patient want to become pregnant in the next year? Ok Either Way    Does the patient's partner want to become pregnant in the next year? Ok Either Way    Would the patient like to discuss contraceptive options today? No      Contraception Wrap Up   Current Method Female Condom    End Method Female Condom             Assessment:     1. Pregnancy examination or test, negative result  2. Irregular periods Had 2 periods in January  3. Patient desires pregnancy Has not used birth control since IUD removed    Plan:     Return in about 9 days for pap and physical and labs

## 2021-03-11 ENCOUNTER — Encounter: Payer: Self-pay | Admitting: Adult Health

## 2021-03-11 ENCOUNTER — Other Ambulatory Visit (HOSPITAL_COMMUNITY)
Admission: RE | Admit: 2021-03-11 | Discharge: 2021-03-11 | Disposition: A | Discharge status: Home / Self Care | Payer: BC Managed Care – PPO | Source: Ambulatory Visit | Attending: Adult Health | Admitting: Adult Health

## 2021-03-11 ENCOUNTER — Ambulatory Visit (INDEPENDENT_AMBULATORY_CARE_PROVIDER_SITE_OTHER): Payer: BC Managed Care – PPO | Admitting: Adult Health

## 2021-03-11 ENCOUNTER — Other Ambulatory Visit: Payer: Self-pay

## 2021-03-11 VITALS — BP 116/71 | HR 70 | Ht 66.0 in | Wt 159.0 lb

## 2021-03-11 DIAGNOSIS — Z319 Encounter for procreative management, unspecified: Secondary | ICD-10-CM

## 2021-03-11 DIAGNOSIS — N926 Irregular menstruation, unspecified: Secondary | ICD-10-CM | POA: Diagnosis not present

## 2021-03-11 DIAGNOSIS — Z01419 Encounter for gynecological examination (general) (routine) without abnormal findings: Secondary | ICD-10-CM

## 2021-03-11 NOTE — Progress Notes (Signed)
Patient ID: Kimberly [name], female   DOB: 1980-04-24, 41 y.o.   MRN: [id number] ?History of Present Illness: ?Kimberly [name] is a 41 year old black female,single, G2P2 in for well woman gyn exam and pap. ?She had 2 periods in January. She would like to get pregnant. She had IUD removed years ago and has same partner.  ? ?PCP is Dr Felecia Shelling. ? ? ?Current Medications, Allergies, Past Medical History, Past Surgical History, Family History and Social History were reviewed in [employer] record.   ? ? ?Review of Systems: ? ?Patient denies any headaches, hearing loss, fatigue, blurred vision, shortness of breath, chest pain, abdominal pain, problems with bowel movements, urination, or intercourse. No joint pain or mood swings.  ?2 periods in January ? ? ?Physical Exam:BP 116/71 (BP Location: Right Arm, Patient Position: Sitting, Cuff Size: Normal)   Pulse 70   Ht 5\' 6"  (1.676 m)   Wt 159 lb (72.1 kg)   LMP 03/01/2021   BMI 25.66 kg/m?   ?General:  Well developed, well nourished, no acute distress ?Skin:  Warm and dry ?Neck:  Midline trachea, normal thyroid, good ROM, no lymphadenopathy ?Lungs; Clear to auscultation bilaterally ?Breast:  No dominant palpable mass, retraction, or nipple discharge ?Cardiovascular: Regular rate and rhythm ?Abdomen:  Soft, non tender, no hepatosplenomegaly ?Pelvic:  External genitalia is normal in appearance, no lesions.  The vagina is normal in appearance. Urethra has no lesions or masses. The cervix is bulbous, and smooth, pap with HR HPV genotyping and GC/CHL performed.   Uterus is felt to be normal size, shape, and contour.  No adnexal masses or tenderness noted.Bladder is non tender, no masses felt. ?Rectal: Deferred ?Extremities/musculoskeletal:  No swelling or varicosities noted, no clubbing or cyanosis ?Psych:  No mood changes, alert and cooperative,seems happy ?AA is 0 ?Fall risk is low ?Depression screen [facility] 2/9 03/11/2021 08/14/2017 04/13/2016  ?Decreased Interest 0 0 0   ?Down, Depressed, Hopeless 0 0 0  ?PHQ - 2 Score 0 0 0  ?Altered sleeping 1 0 -  ?Tired, decreased energy 1 0 -  ?Change in appetite 1 0 -  ?Feeling bad or failure about yourself  1 - -  ?Trouble concentrating 1 0 -  ?Moving slowly or fidgety/restless 0 0 -  ?Suicidal thoughts 0 0 -  ?PHQ-9 Score 5 0 -  ?  ?GAD 7 : Generalized Anxiety Score 03/11/2021  ?Nervous, Anxious, on Edge 0  ?Control/stop worrying 1  ?Worry too much - different things 1  ?Trouble relaxing 1  ?Restless 0  ?Easily annoyed or irritable 1  ?Afraid - awful might happen 0  ?Total GAD 7 Score 4  ? ? [facility] - 03/11/21 1528   ? ?  ? Pregnancy Intention Screening  ? Does the patient want to become pregnant in the next year? Yes   ? Does the patient's partner want to become pregnant in the next year? Yes   ? Would the patient like to discuss contraceptive options today? No   ?  ? Contraception Wrap Up  ? Current Method Female Condom   ? End Method Female Condom   ? Contraception Counseling Provided No   ? ?  ?  ? ?  ?  ? Examination chaperoned by 05/11/21 LPN ? ?Impression and Plan: ?1. Encounter for gynecological examination with Papanicolaou smear of cervix ?Pap sent ?Pap in 3 years if normal ?Physical in 1 year ?- Cytology - PAP( [place]) ?Will check labs  ?-  CBC ?- Comprehensive metabolic panel ?- TSH ? ?2. Patient desires pregnancy ?Will check progesterone level 03/22/21 to see if ovulated  ?- Progesterone ?Will talk when labs back ? ?3. Irregular periods ?Will check TSH  ?- TSH  ? ? ? ?  ?  ?

## 2021-03-16 ENCOUNTER — Other Ambulatory Visit: Payer: Self-pay | Admitting: Adult Health

## 2021-03-16 ENCOUNTER — Telehealth: Payer: Self-pay | Admitting: Adult Health

## 2021-03-16 LAB — CYTOLOGY - PAP
Chlamydia: NEGATIVE
Comment: NEGATIVE
Comment: NEGATIVE
Comment: NORMAL
Diagnosis: NEGATIVE
High risk HPV: NEGATIVE
Neisseria Gonorrhea: NEGATIVE

## 2021-03-16 MED ORDER — METRONIDAZOLE 0.75 % VA GEL: 1.0000 | Freq: Every day | VAGINAL | 0 refills | Status: AC

## 2021-03-16 MED ORDER — METRONIDAZOLE 500 MG PO TABS: 500.0000 mg | ORAL_TABLET | Freq: Two times a day (BID) | ORAL | 0 refills | Status: DC

## 2021-03-16 NOTE — Telephone Encounter (Signed)
VM full, will rx metrogel ?

## 2021-03-16 NOTE — Progress Notes (Signed)
+  BV on pap will rx flagyl  

## 2021-03-16 NOTE — Addendum Note (Signed)
Addended by: Cyril Mourning A on: 03/16/2021 02:43 PM ? ? Modules accepted: Orders ? ?

## 2021-03-16 NOTE — Telephone Encounter (Signed)
Patient would like the suppository form of the flagyl. Please advise.  ?

## 2021-03-22 DIAGNOSIS — Z01419 Encounter for gynecological examination (general) (routine) without abnormal findings: Secondary | ICD-10-CM | POA: Diagnosis not present

## 2021-03-22 DIAGNOSIS — N926 Irregular menstruation, unspecified: Secondary | ICD-10-CM | POA: Diagnosis not present

## 2021-03-22 DIAGNOSIS — Z319 Encounter for procreative management, unspecified: Secondary | ICD-10-CM | POA: Diagnosis not present

## 2021-03-23 LAB — COMPREHENSIVE METABOLIC PANEL
ALT: 11 IU/L (ref 0–32)
AST: 17 IU/L (ref 0–40)
Albumin/Globulin Ratio: 2 (ref 1.2–2.2)
Albumin: 4.5 g/dL (ref 3.8–4.8)
Alkaline Phosphatase: 70 IU/L (ref 44–121)
BUN/Creatinine Ratio: 20 (ref 9–23)
BUN: 15 mg/dL (ref 6–20)
Bilirubin Total: <0.2 mg/dL (ref 0.0–1.2)
CO2: 22 mmol/L (ref 20–29)
Calcium: 9.2 mg/dL (ref 8.7–10.2)
Chloride: 103 mmol/L (ref 96–106)
Creatinine, Ser: 0.76 mg/dL (ref 0.57–1.00)
Globulin, Total: 2.2 g/dL (ref 1.5–4.5)
Glucose: 94 mg/dL (ref 70–99)
Potassium: 4.3 mmol/L (ref 3.5–5.2)
Sodium: 141 mmol/L (ref 134–144)
Total Protein: 6.7 g/dL (ref 6.0–8.5)
eGFR: 102 mL/min/{1.73_m2} (ref >59)

## 2021-03-23 LAB — CBC
Hematocrit: 38.7 % (ref 34.0–46.6)
Hemoglobin: 12.7 g/dL (ref 11.1–15.9)
MCH: 26.9 pg (ref 26.6–33.0)
MCHC: 32.8 g/dL (ref 31.5–35.7)
MCV: 82 fL (ref 79–97)
Platelets: 284 10*3/uL (ref 150–450)
RBC: 4.72 x10E6/uL (ref 3.77–5.28)
RDW: 13.1 % (ref 11.7–15.4)
WBC: 8.1 10*3/uL (ref 3.4–10.8)

## 2021-03-23 LAB — TSH: TSH: 1 u[IU]/mL (ref 0.450–4.500)

## 2021-03-23 LAB — PROGESTERONE: Progesterone: 13.3 ng/mL

## 2022-04-04 ENCOUNTER — Telehealth: Payer: Self-pay | Admitting: Adult Health

## 2022-04-04 ENCOUNTER — Encounter: Payer: Self-pay | Admitting: *Deleted

## 2022-04-04 NOTE — Telephone Encounter (Signed)
MyChart message sent advising pt to schedule an appt since it's been over a year since she was seen. [place]

## 2022-04-04 NOTE — Telephone Encounter (Signed)
Pt states she would like to know if she has any blockages. Pt is trying to conceive. Pt is asking if she needs an appointment and what kind
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
